# Patient Record
Sex: Female | Born: 1955
Health system: Southern US, Community
[De-identification: ages and names within clinical notes are randomized; demographics above are authoritative.]

## PROBLEM LIST (undated history)

## (undated) DIAGNOSIS — K219 Gastro-esophageal reflux disease without esophagitis: Secondary | ICD-10-CM

## (undated) DIAGNOSIS — G039 Meningitis, unspecified: Secondary | ICD-10-CM

## (undated) DIAGNOSIS — G709 Myoneural disorder, unspecified: Secondary | ICD-10-CM

## (undated) DIAGNOSIS — R569 Unspecified convulsions: Secondary | ICD-10-CM

## (undated) HISTORY — DX: Meningitis, unspecified: G03.9

## (undated) HISTORY — PX: ABDOMINAL HYSTERECTOMY: SHX81

## (undated) HISTORY — DX: Unspecified convulsions: R56.9

## (undated) HISTORY — PX: CHOLECYSTECTOMY: SHX55

## (undated) HISTORY — PX: APPENDECTOMY: SHX54

## (undated) SURGERY — Surgical Case
Anesthesia: *Unknown

---

## 2001-11-18 ENCOUNTER — Encounter: Payer: Self-pay | Admitting: Emergency Medicine

## 2001-11-18 ENCOUNTER — Emergency Department (HOSPITAL_COMMUNITY): Admission: EM | Admit: 2001-11-18 | Discharge: 2001-11-18 | Payer: Self-pay | Admitting: Emergency Medicine

## 2002-11-17 ENCOUNTER — Emergency Department (HOSPITAL_COMMUNITY): Admission: EM | Admit: 2002-11-17 | Discharge: 2002-11-17 | Payer: Self-pay | Admitting: *Deleted

## 2004-02-27 ENCOUNTER — Ambulatory Visit (HOSPITAL_COMMUNITY): Admission: RE | Admit: 2004-02-27 | Discharge: 2004-02-27 | Payer: Self-pay | Admitting: Gastroenterology

## 2004-02-27 ENCOUNTER — Encounter (INDEPENDENT_AMBULATORY_CARE_PROVIDER_SITE_OTHER): Payer: Self-pay | Admitting: Specialist

## 2008-04-18 ENCOUNTER — Emergency Department (HOSPITAL_COMMUNITY): Admission: EM | Admit: 2008-04-18 | Discharge: 2008-04-18 | Payer: Self-pay | Admitting: Family Medicine

## 2010-01-27 ENCOUNTER — Encounter: Payer: Self-pay | Admitting: Internal Medicine

## 2010-01-27 ENCOUNTER — Ambulatory Visit: Payer: Self-pay | Admitting: Internal Medicine

## 2010-01-27 DIAGNOSIS — Z9189 Other specified personal risk factors, not elsewhere classified: Secondary | ICD-10-CM | POA: Insufficient documentation

## 2010-01-27 DIAGNOSIS — M25569 Pain in unspecified knee: Secondary | ICD-10-CM

## 2010-01-27 DIAGNOSIS — Z8669 Personal history of other diseases of the nervous system and sense organs: Secondary | ICD-10-CM | POA: Insufficient documentation

## 2010-01-27 DIAGNOSIS — R569 Unspecified convulsions: Secondary | ICD-10-CM

## 2010-01-27 DIAGNOSIS — K219 Gastro-esophageal reflux disease without esophagitis: Secondary | ICD-10-CM | POA: Insufficient documentation

## 2010-02-04 ENCOUNTER — Ambulatory Visit (HOSPITAL_COMMUNITY): Admission: RE | Admit: 2010-02-04 | Discharge: 2010-02-04 | Payer: Self-pay | Admitting: Internal Medicine

## 2010-02-04 DIAGNOSIS — R928 Other abnormal and inconclusive findings on diagnostic imaging of breast: Secondary | ICD-10-CM | POA: Insufficient documentation

## 2010-02-07 ENCOUNTER — Ambulatory Visit: Payer: Self-pay | Admitting: Internal Medicine

## 2010-02-07 LAB — CONVERTED CEMR LAB
ALT: 11 units/L (ref 0–35)
AST: 16 units/L (ref 0–37)
Albumin: 3.6 g/dL (ref 3.5–5.2)
Alkaline Phosphatase: 63 units/L (ref 39–117)
BUN: 18 mg/dL (ref 6–23)
Basophils Absolute: 0.3 10*3/uL — ABNORMAL HIGH (ref 0.0–0.1)
Basophils Relative: 4.1 % — ABNORMAL HIGH (ref 0.0–3.0)
Bilirubin Urine: NEGATIVE
Bilirubin, Direct: 0 mg/dL (ref 0.0–0.3)
CO2: 29 meq/L (ref 19–32)
Calcium: 8.6 mg/dL (ref 8.4–10.5)
Chloride: 105 meq/L (ref 96–112)
Cholesterol: 238 mg/dL — ABNORMAL HIGH (ref 0–200)
Creatinine, Ser: 0.6 mg/dL (ref 0.4–1.2)
Direct LDL: 120.2 mg/dL
Eosinophils Absolute: 0.3 10*3/uL (ref 0.0–0.7)
Eosinophils Relative: 5.2 % — ABNORMAL HIGH (ref 0.0–5.0)
GFR calc non Af Amer: 120.01 mL/min (ref >60)
Glucose, Bld: 83 mg/dL (ref 70–99)
HCT: 41.3 % (ref 36.0–46.0)
HDL: 82 mg/dL (ref >39.00)
Hemoglobin, Urine: NEGATIVE
Hemoglobin: 14.3 g/dL (ref 12.0–15.0)
Ketones, ur: NEGATIVE mg/dL
Lymphocytes Relative: 43.6 % (ref 12.0–46.0)
Lymphs Abs: 2.9 10*3/uL (ref 0.7–4.0)
MCHC: 34.6 g/dL (ref 30.0–36.0)
MCV: 93.8 fL (ref 78.0–100.0)
Monocytes Absolute: 0.7 10*3/uL (ref 0.1–1.0)
Monocytes Relative: 10 % (ref 3.0–12.0)
Neutro Abs: 2.4 10*3/uL (ref 1.4–7.7)
Neutrophils Relative %: 37.1 % — ABNORMAL LOW (ref 43.0–77.0)
Nitrite: NEGATIVE
Platelets: 201 10*3/uL (ref 150.0–400.0)
Potassium: 4.3 meq/L (ref 3.5–5.1)
RBC: 4.4 M/uL (ref 3.87–5.11)
RDW: 14 % (ref 11.5–14.6)
Sodium: 140 meq/L (ref 135–145)
Specific Gravity, Urine: 1.01 (ref 1.000–1.030)
TSH: 0.84 microintl units/mL (ref 0.35–5.50)
Total Bilirubin: 0.2 mg/dL — ABNORMAL LOW (ref 0.3–1.2)
Total CHOL/HDL Ratio: 3
Total Protein, Urine: NEGATIVE mg/dL
Total Protein: 6.3 g/dL (ref 6.0–8.3)
Triglycerides: 112 mg/dL (ref 0.0–149.0)
Urine Glucose: NEGATIVE mg/dL
Urobilinogen, UA: 0.2 (ref 0.0–1.0)
VLDL: 22.4 mg/dL (ref 0.0–40.0)
WBC: 6.6 10*3/uL (ref 4.5–10.5)
pH: 6.5 (ref 5.0–8.0)

## 2010-02-12 ENCOUNTER — Encounter: Payer: Self-pay | Admitting: Internal Medicine

## 2010-02-14 ENCOUNTER — Encounter: Admission: RE | Admit: 2010-02-14 | Discharge: 2010-02-14 | Payer: Self-pay | Admitting: Internal Medicine

## 2010-05-15 ENCOUNTER — Encounter (INDEPENDENT_AMBULATORY_CARE_PROVIDER_SITE_OTHER): Payer: Self-pay | Admitting: Obstetrics and Gynecology

## 2010-05-15 ENCOUNTER — Ambulatory Visit (HOSPITAL_COMMUNITY)
Admission: RE | Admit: 2010-05-15 | Discharge: 2010-05-16 | Payer: Self-pay | Source: Home / Self Care | Attending: Obstetrics and Gynecology | Admitting: Obstetrics and Gynecology

## 2010-05-19 LAB — BASIC METABOLIC PANEL
BUN: 12 mg/dL (ref 6–23)
BUN: 9 mg/dL (ref 6–23)
CO2: 26 mEq/L (ref 19–32)
CO2: 29 mEq/L (ref 19–32)
Calcium: 8.9 mg/dL (ref 8.4–10.5)
Calcium: 8 mg/dL — ABNORMAL LOW (ref 8.4–10.5)
Chloride: 107 mEq/L (ref 96–112)
Chloride: 108 mEq/L (ref 96–112)
Creatinine, Ser: 0.53 mg/dL (ref 0.4–1.2)
Creatinine, Ser: 0.53 mg/dL (ref 0.4–1.2)
GFR calc Af Amer: >60 mL/min (ref >60)
GFR calc Af Amer: >60 mL/min (ref >60)
GFR calc non Af Amer: >60 mL/min (ref >60)
GFR calc non Af Amer: >60 mL/min (ref >60)
Glucose, Bld: 88 mg/dL (ref 70–99)
Glucose, Bld: 105 mg/dL — ABNORMAL HIGH (ref 70–99)
Potassium: 4.4 mEq/L (ref 3.5–5.1)
Potassium: 3.5 mEq/L (ref 3.5–5.1)
Sodium: 139 mEq/L (ref 135–145)
Sodium: 140 mEq/L (ref 135–145)

## 2010-05-19 LAB — CBC
HCT: 41.8 % (ref 36.0–46.0)
HCT: 31.2 % — ABNORMAL LOW (ref 36.0–46.0)
Hemoglobin: 14.2 g/dL (ref 12.0–15.0)
Hemoglobin: 10.5 g/dL — ABNORMAL LOW (ref 12.0–15.0)
MCH: 31.1 pg (ref 26.0–34.0)
MCH: 31 pg (ref 26.0–34.0)
MCHC: 34 g/dL (ref 30.0–36.0)
MCHC: 33.7 g/dL (ref 30.0–36.0)
MCV: 91.5 fL (ref 78.0–100.0)
MCV: 92 fL (ref 78.0–100.0)
Platelets: 238 10*3/uL (ref 150–400)
Platelets: 171 10*3/uL (ref 150–400)
RBC: 4.57 MIL/uL (ref 3.87–5.11)
RBC: 3.39 MIL/uL — ABNORMAL LOW (ref 3.87–5.11)
RDW: 13.8 % (ref 11.5–15.5)
RDW: 13.9 % (ref 11.5–15.5)
WBC: 8.7 10*3/uL (ref 4.0–10.5)
WBC: 7.3 10*3/uL (ref 4.0–10.5)

## 2010-05-19 LAB — SURGICAL PCR SCREEN
MRSA, PCR: NEGATIVE
Staphylococcus aureus: NEGATIVE

## 2010-05-23 NOTE — Op Note (Signed)
Diana Vega, Diana Vega            ACCOUNT NO.:  [id number]  MEDICAL RECORD NO.:  [id number]          PATIENT TYPE:  OIB  LOCATION:  9316                          FACILITY:  [facility]  PHYSICIAN:  Edelyn Heidel L. Cletus Paris, M.D.DATE OF BIRTH:  05-07-1955  DATE OF PROCEDURE:  05/15/2010 DATE OF DISCHARGE:                              OPERATIVE REPORT   PREOPERATIVE DIAGNOSIS:  Symptomatic fibroids.  POSTOPERATIVE DIAGNOSIS:  Symptomatic fibroids.  PROCEDURE:  Laparoscopic-assisted vaginal hysterectomy.  SURGEON:  Jullien Granquist L. Vincente Poli, MD  ASSISTANT:  Freddy Finner, MD  ANESTHESIA:  General.  FINDINGS:  Large fibroid uterus.  SPECIMENS:  Uterus and cervix sent to Pathology.  ESTIMATED BLOOD LOSS:  200 mL.  COMPLICATIONS:  None.  DESCRIPTION OF PROCEDURE:  The patient was taken to the operating room. She was intubated.  She was then prepped and draped in the standard fashion.  A Foley catheter was inserted and then attention was turned to the umbilicus where small infraumbilical incision was made and the Veress needle was inserted.  Pneumoperitoneum was performed in standard fashion with the Veress needle.  An 11-mm trocar was inserted.  The patient was then gently placed in Trendelenburg position and a 5-mm trocar was inserted under direct visualization.  Exam of the pelvis revealed the following.  She had a very large uterus and myomatous.  The ovaries were normal.  We used the EnSeal instrument, basically burned and cut the triple pedicle down to the round ligaments on both sides without any difficulty and with very good hemostasis.  We then went down to the vagina after we released the pneumoperitoneum and left the trocars in for a short period of time, placed a weighted speculum in the vagina, made a circumferential incision around the cervix, entered the posterior cul-de-sac sharply using Mayo scissors, and entered the anterior cul-de-sac sharply using Metzenbaum scissors.  We  then placed curved Heaney clamps across each uterosacral cardinal ligaments.  Each pedicle was clamped, cut, and suture ligated using 0 Vicryl suture.  We then carefully walked our way up the broad ligament.  I did visualize the uterine artery on either side, clamped that, staying snug beside the uterus.  Each pedicle was clamped, cut, suture ligated using 0 Vicryl suture.  At this point, the uterus was so wide, we could not get around with our clamps and we decided to morcellate especially since we had already isolated the blood supply, so then I used a scalpel and basically made a V-shaped incision through the cervix, through the uterus, and to bivalve it and then to reduce it.  We were then able to remove the very large fibroid that was over on her left side.  At that point, we were then able to get around the remainder of the broad ligament on either side with curved Heaney clamps to remove the specimen completely.  The specimen was identified as uterus, cervix with fibroids.  We then secured the pedicles with suture ligature of 0 Vicryl suture.  We then closed the vaginal cuff with a running stitch from 3-9 o'clock using a running locked stitch using 0 Vicryl suture.  Hemostasis was excellent  and we closed the cuff completely anterior to posterior using 0 Vicryl suture.  At this point, we then went back to the abdomen, replaced the pneumoperitoneum, irrigated the pelvis with Nezhat.  There was no blood noted at all in the cul-de-sac.  We noted bilateral ureteral peristalsis.  There was no need for any cautery because hemostasis was excellent.  We released the pneumoperitoneum.  The infraumbilical incision was closed using 0 Vicryl.  The skin incisions were closed using Dermabond.  All sponge, lap, and instrument counts were correct x2.  The patient went to recovery room in stable condition.     Aria Jarrard L. Vincente Poli, M.D.     Florestine Avers  D:  05/15/2010  T:  05/15/2010  Job:   [id number]  Electronically Signed by Marcelle Overlie M.D. on 05/23/2010 08:43:49 AM

## 2010-06-03 NOTE — Assessment & Plan Note (Signed)
Summary: f/u--[facility]   Vital Signs:  Patient profile:   55 year old female Height:      63 inches (160.02 cm) Weight:      199.0 pounds (90.45 kg) O2 Sat:      97 % on Room air Temp:     98.7 degrees F (37.06 degrees C) oral Pulse rate:   83 / minute BP sitting:   120 / 82  (left arm) Cuff size:   large  Vitals Entered By: Orlan Leavens RMA (February 07, 2010 2:16 PM)  O2 Flow:  Room air CC: CPX Is Patient Diabetic? No Pain Assessment Patient in pain? no        Primary Care Provider:  Newt Lukes MD  CC:  CPX.  History of Present Illness: here for followup -  still has not done CPX labs from last week - will complete today  abnormal mammo this week - f/u right breast planned - reviewed report   seizure disorder - last breakthru sz activity 11/2009 - follows with neuro - [facility]olds- for same - reports compliance with ongoing medical treatment and no changes in medication dose or frequency. denies adverse side effects related to current therapy.   Clinical Review Panels:  Prevention   Last Mammogram:  mammograms for comparison - BI-RADS 0^MM DIGITAL SCREENING (02/04/2010)  Immunizations   Last Tetanus Booster:  Tdap (02/07/2010)   Current Medications (verified): 1)  Keppra 500 Mg Tabs (Levetiracetam) .... Take 1 Two Times A Day 2)  Depakote 500 Mg Tbec (Divalproex Sodium) .... Take 3 Two Times A Day 3)  Dilantin 100 Mg Caps (Phenytoin Sodium Extended) .... Take 2 Two Times A Day 4)  Dilantin 30 Mg Caps (Phenytoin Sodium Extended) .... Take 1 At Bedtime 5)  Meloxicam 15 Mg Tabs (Meloxicam) .Marland Kitchen.. 1 By Mouth Once Daily  Allergies (verified): 1)  ! Vicodin 2)  ! Codeine 3)  ! * Eggs  Past History:  Past medical, surgical, family and social histories (including risk factors) reviewed, and no changes noted (except as noted below).  Past Medical History: seizure d/o GERD   MD roster: neuro - reynolds  Past Surgical History: Reviewed history from 01/27/2010  and no changes required. Appendectomy (1610) Cholecystectomy (1988) Tonsillectomy (childhood) Breast bx (1996)  Family History: Reviewed history from 01/27/2010 and no changes required. Family History of Arthritis (mother) Family History Breast cancer 1st degree relative <50 (father & aunt) Family History of Colon CA 1st degree relative <60 (mother) Family History Diabetes 1st degree relative (grandparent) Family History High cholesterol (parent) Kidney disease (brother) Emotional illness (nephew)  Social History: Reviewed history from 01/27/2010 and no changes required. 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 I have reviewed all other systems and they were negative.   Physical Exam  General:  overweight; alert, well-developed, well-nourished, and cooperative to examination.    Lungs:  normal respiratory effort, no intercostal retractions or use of accessory muscles; normal breath sounds bilaterally - no crackles and no wheezes.    Heart:  normal rate, regular rhythm, no murmur, and no rub. BLE without edema.  Skin:  1 cm SK left breast medially Psych:  Oriented X3, memory intact for recent and remote, normally interactive, good eye contact, not anxious appearing, not depressed appearing, and not agitated.      Impression &  Recommendations:  Problem # 1:  ABNORMAL MAMMOGRAM, RIGHT BREAST (ICD-793.80) reviewed report with pt -  will f/u with women[facility] on further testing as advised  Problem # 2:  SEIZURE DISORDER (ICD-780.39)  Her updated medication list for this problem includes:    Keppra 500 Mg Tabs (Levetiracetam) .Marland Kitchen... Take 1 two times a day    Depakote 500 Mg Tbec (Divalproex sodium) .Marland Kitchen... Take 3 two times a day    Dilantin 100 Mg Caps (Phenytoin sodium extended) .Marland Kitchen... Take 2 two times a day    Dilantin 30 Mg Caps (Phenytoin sodium  extended) .Marland Kitchen... Take 1 at bedtime  no breakthru activity since 11/2009 - cont med mgmt and levels as per neuro (reynolds) as ongoing  Complete Medication List: 1)  Keppra 500 Mg Tabs (Levetiracetam) .... Take 1 two times a day 2)  Depakote 500 Mg Tbec (Divalproex sodium) .... Take 3 two times a day 3)  Dilantin 100 Mg Caps (Phenytoin sodium extended) .... Take 2 two times a day 4)  Dilantin 30 Mg Caps (Phenytoin sodium extended) .... Take 1 at bedtime 5)  Meloxicam 15 Mg Tabs (Meloxicam) .Marland Kitchen.. 1 by mouth once daily  Other Orders: Tdap => 20yrs IM ([address]) Admin 1st Vaccine (60454)  Patient Instructions: 1)  it was good to see you today. 2)  test(s) ordered today - your results will be called to [phone] and will leave message if no answer; if any changes need to be made or there are abnormal results, you will be notified at that time 3)  look into followup with GI for colo and women[facility] for right breast abnormality - call us if help needed 4)  Please schedule a follow-up appointment in 1 year, call sooner if problems   Immunizations Administered:  Tetanus Vaccine:    Vaccine Type: Tdap    Site: left deltoid    Mfr: GlaxoSmithKline    Dose: 0.5 ml    Route: IM    Given by: Orlan Leavens RMA    Exp. Date: 02/21/2012    Lot #: [id number]    VIS given: 02/07/10

## 2010-06-03 NOTE — Assessment & Plan Note (Signed)
Summary: New / Umr / # / cd   Vital Signs:  Patient profile:   55 year old female Height:      63 inches (160.02 cm) Weight:      199.8 pounds (90.82 kg) BMI:     35.52 O2 Sat:      94 % on Room air Temp:     97.6 degrees F (36.44 degrees C) oral Pulse rate:   73 / minute BP sitting:   102 / 72  (left arm) Cuff size:   regular  Vitals Entered By: Orlan Leavens RMA (January 27, 2010 1:53 PM)  O2 Flow:  Room air CC: New patient Is Patient Diabetic? No Pain Assessment Patient in pain? no        Primary Care Provider:  Newt Lukes MD  CC:  New patient.  History of Present Illness: new pt to me and our practice, here to est care  patient is here today for annual physical. Patient feels well and has no complaints.    reviewed chronic med issues: 1) seizure disorder - last breakthru sz activity 11/2009 - follows with neuro - [facility]olds- for same - reports compliance with ongoing medical treatment and no changes in medication dose or frequency. denies adverse side effects related to current therapy.   Preventive Screening-Counseling & Management  Alcohol-Tobacco     Alcohol drinks/day: <1     Alcohol Counseling: not indicated; use of alcohol is not excessive or problematic     Smoking Status: quit     Tobacco Counseling: not to resume use of tobacco products  Caffeine-Diet-Exercise     Does Patient Exercise: no     Exercise Counseling: to improve exercise regimen     Depression Counseling: not indicated; screening negative for depression  Safety-Violence-Falls     Seat Belt Counseling: not indicated; patient wears seat belts     Helmet Counseling: not indicated; patient wears helmet when riding bicycle/motocycle     Firearm Counseling: not applicable     Violence Counseling: not indicated; no violence risk noted     Fall Risk Counseling: not indicated; no significant falls noted  Current Medications (verified): 1)  Keppra 500 Mg Tabs (Levetiracetam) .... Take 1  Two Times A Day 2)  Depakote 500 Mg Tbec (Divalproex Sodium) .... Take 3 Two Times A Day 3)  Dilantin 100 Mg Caps (Phenytoin Sodium Extended) .... Take 2 Two Times A Day 4)  Dilantin 30 Mg Caps (Phenytoin Sodium Extended) .... Take 1 At Bedtime 5)  Meloxicam 15 Mg Tabs (Meloxicam) .Marland Kitchen.. 1 By Mouth Once Daily  Allergies (verified): 1)  ! Vicodin 2)  ! Codeine 3)  ! * Eggs  Past History:  Past Medical History: seizure d/o GERD  MD roster: neuro - reynolds  Past Surgical History: Appendectomy (6433) Cholecystectomy (1988) Tonsillectomy (childhood) Breast bx (1996)  Family History: Family History of Arthritis (mother) Family History Breast cancer 1st degree relative <50 (father & aunt) Family History of Colon CA 1st degree relative <60 (mother) Family History Diabetes 1st degree relative (grandparent) Family History High cholesterol (parent) Kidney disease (brother) Emotional illness (nephew)  Social History: Former Games developer; social alcohol married, lives with spouse originally from [place] - son still lives there 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 I have reviewed all other systems  and they were negative.   Physical Exam  General:  overweight; alert, well-developed, well-nourished, and cooperative to examination.    Head:  Normocephalic and atraumatic without obvious abnormalities. No apparent alopecia or balding. Eyes:  vision grossly intact; pupils equal, round and reactive to light.  conjunctiva and lids normal.    Ears:  normal pinnae bilaterally, without erythema, swelling, or tenderness to palpation. TM largely occluded due to cerumen impaction. Hearing grossly normal bilaterally  Mouth:  teeth and gums in good repair; mucous membranes moist, without lesions or ulcers. oropharynx clear without exudate, no erythema.  Neck:   supple, full ROM, no masses, no thyromegaly; no thyroid nodules or tenderness. no JVD or carotid bruits.   Lungs:  normal respiratory effort, no intercostal retractions or use of accessory muscles; normal breath sounds bilaterally - no crackles and no wheezes.    Heart:  normal rate, regular rhythm, no murmur, and no rub. BLE without edema.  Abdomen:  soft, non-tender, normal bowel sounds, no distention; no masses and no appreciable hepatomegaly or splenomegaly.   Genitalia:  defer gyn Msk:  No deformity or scoliosis noted of thoracic or lumbar spine.  right knee: full range of motion, no joint effusion or swelling. no erythema or abnormal warmth. Stable to ligamentous testing. Nontender to palpation. Neurovascularly intact.  Neurologic:  alert & oriented X3 and cranial nerves II-XII symetrically intact.  strength normal in all extremities, sensation intact to light touch, and gait normal. speech fluent without dysarthria or aphasia; follows commands with good comprehension.  Skin:  no rashes, vesicles, ulcers, or erythema. No nodules or irregularity to palpation.  Psych:  Oriented X3, memory intact for recent and remote, normally interactive, good eye contact, not anxious appearing, not depressed appearing, and not agitated.      Impression & Recommendations:  Problem # 1:  PREVENTIVE HEALTH CARE (ICD-V70.0) Patient has been counseled on age-appropriate routine health concerns for screening and prevention. These are reviewed and up-to-date. Immunizations are up-to-date or declined. Labs and ECG reviewed.  Orders: TLB-Lipid Panel (80061-LIPID) TLB-BMP (Basic Metabolic Panel-BMET) (80048-METABOL) TLB-CBC Platelet - w/Differential (85025-CBCD) TLB-Hepatic/Liver Function Pnl (80076-HEPATIC) TLB-TSH (Thyroid Stimulating Hormone) (84443-TSH) TLB-Udip w/ Micro (81001-URINE) EKG w/ Interpretation ([address]) Gastroenterology Referral (GI) Gynecologic Referral (Gyn) Radiology Referral  (Radiology)  Problem # 2:  SEIZURE DISORDER (ICD-780.39) no breakthru activity since 11/2009 - cont med mgmt and levels as per neuro (reynolds) as ongoing Her updated medication list for this problem includes:    Keppra 500 Mg Tabs (Levetiracetam) .Marland Kitchen... Take 1 two times a day    Depakote 500 Mg Tbec (Divalproex sodium) .Marland Kitchen... Take 3 two times a day    Dilantin 100 Mg Caps (Phenytoin sodium extended) .Marland Kitchen... Take 2 two times a day    Dilantin 30 Mg Caps (Phenytoin sodium extended) .Marland Kitchen... Take 1 at bedtime  Problem # 3:  KNEE PAIN, RIGHT (ICD-719.46)  no effusion - likely post traumatic from sz 11/2009 (injury?) exam benign - try meloxicam - erx done Her updated medication list for this problem includes:    Meloxicam 15 Mg Tabs (Meloxicam) .Marland Kitchen... 1 by mouth once daily  Discussed strengthening exercises, use of ice or heat, and medications.   Complete Medication List: 1)  Keppra 500 Mg Tabs (Levetiracetam) .... Take 1 two times a day 2)  Depakote 500 Mg Tbec (Divalproex sodium) .... Take 3 two times a day 3)  Dilantin 100 Mg Caps (Phenytoin sodium extended) .... Take 2 two times a day 4)  Dilantin 30  Mg Caps (Phenytoin sodium extended) .... Take 1 at bedtime 5)  Meloxicam 15 Mg Tabs (Meloxicam) .Marland Kitchen.. 1 by mouth once daily  Patient Instructions: 1)  it was good to see you today. 2)  exam and EKG look normal -  3)  medications reviewed - use meloxicam in place of ibuprofen for knee pain - your prescription has been electronically submitted to your pharmacy. Please take as directed. Contact our office if you believe you're having problems with the medication(s).  4)  return for labs one AM when fasting (nothing to eat 8 hours before labs) - your results will be posted on the phone tree for review in 48-72 hours from the time of test completion; call [phone] and enter your 9 digit MRN (listed above on this page, just below your name); if any changes need to be made or there are abnormal results,  you will be contacted directly.  5)  it is important that you work on losing weight - monitor your diet and consume fewer calories such as less carbohydrates (sugar) and less fat. you also need to increase your physical activity level - start by walking for 10-20 minutes 3 times per week and work up to 30 minutes 4-5 times each week. 6)  we'll make referral for mammogram, gynecology eval and colonscopy as discussed . Our office will contact you regarding these appointments once made.  7)  Please schedule a follow-up appointment annually for your medical physical, call sooner if problems.  Prescriptions: MELOXICAM 15 MG TABS (MELOXICAM) 1 by mouth once daily  #30 x 0   Entered and Authorized by:   Newt Lukes MD   Signed by:   Newt Lukes MD on 01/27/2010   Method used:   Electronically to        [facility]  [phone]* (retail)       [address]       [place], [place]  [address]       Ph: [phone] or [phone]       Fax: [phone]   RxID:   [id number]

## 2010-06-06 ENCOUNTER — Encounter (INDEPENDENT_AMBULATORY_CARE_PROVIDER_SITE_OTHER): Payer: Self-pay | Admitting: *Deleted

## 2010-06-11 NOTE — Letter (Signed)
Summary: Referral - not able to see patient  [facility]  [address] [place], [place] [address]   Phone: [phone]  Fax: [phone]    June 06, 2010  Rene Paci, MD [address] [place], [place] [address]   Re:   Diana Vega DOB:  Jan 07, 1956 MRN:   [id number]    Dear Dr. Felicity Coyer:  Thank you for your kind referral of the above patient.  We have attempted to schedule the recommended procedure Screening Colonoscopy but have not been able to schedule because:  X   The patient was not available by phone and/or has not returned our calls.  ___ The patient declined to schedule the procedure at this time.  We appreciate the referral and hope that we will have the opportunity to treat this patient in the future.    Sincerely,    [facility] [phone]

## 2010-09-01 ENCOUNTER — Other Ambulatory Visit: Payer: Self-pay | Admitting: Internal Medicine

## 2010-09-19 NOTE — Op Note (Signed)
Diana Vega, Diana Vega            ACCOUNT NO.:  [id number]   MEDICAL RECORD NO.:  [id number]          PATIENT TYPE:  AMB   LOCATION:  ENDO                         FACILITY:  [facility]   PHYSICIAN:  John C. Madilyn Fireman, M.D.    DATE OF BIRTH:  [birth year]   DATE OF PROCEDURE:  02/27/2004  DATE OF DISCHARGE:                                 OPERATIVE REPORT   PROCEDURE:  Esophagogastroduodenoscopy with biopsy.   INDICATIONS FOR PROCEDURE:  Nausea, vomiting and abdominal fullness with  gradual worsening.   DESCRIPTION OF PROCEDURE:  The patient was placed in the left lateral  decubitus position then placed on the pulse monitor with continuous low flow  oxygen delivered by nasal cannula. She was sedated with 75 mcg IV fentanyl  and 6 mg IV Versed. The Olympus video endoscope was advanced under direct  vision into the oropharynx and esophagus. The esophagus was straight and of  normal caliber at the squamocolumnar line at 38 cm. There was a questionable  nodule versus focal inflammation just distal to the GE junction, it was  biopsied.  It was probably no more than 6-8 mm in diameter.  The remainder  of the stomach appeared normal including the fundus, body and antrum.  The  pylorus was nondeformed and widely patent and easily allowed passage of the  endoscope tip into the duodenum.  There was some erythema and granularity in  the duodenal bulb, the post bulbar duodenum appeared normal.  The scope was  withdrawn back into the stomach and CLOtest obtained. The scope was then  withdrawn and the patient returned to the recovery room in stable condition.  She tolerated the procedure well and there were no immediate complications.   IMPRESSION:  1.  Questionable nodule of the gastroesophageal junction.  2.  Duodenitis.   PLAN:  1.  Await CLOtest and biopsies.  2.  Await liver function tests ordered yesterday.  3.  Consider nuclear medicine gastric emptying study.  4.  Consider course or Reglan or  proton pump inhibitor keeping in mind that      she developed diarrhea in response to Prilosec earlier.     [facility]/MEDQ  D:  02/27/2004  T:  02/27/2004  Job:  [id number]   cc:   Thora Lance, M.D.  [address]  [place]  [place] [address]  Fax: [phone]

## 2010-09-25 ENCOUNTER — Other Ambulatory Visit: Payer: Self-pay | Admitting: *Deleted

## 2010-09-25 MED ORDER — MELOXICAM 15 MG PO TABS: 15.0000 mg | ORAL_TABLET | Freq: Every day | ORAL | Status: DC

## 2012-11-21 ENCOUNTER — Telehealth: Payer: Self-pay | Admitting: Neurology

## 2012-11-24 MED ORDER — DIVALPROEX SODIUM 250 MG PO DR TAB: 1500.0000 mg | DELAYED_RELEASE_TABLET | Freq: Two times a day (BID) | ORAL | Status: DC

## 2012-11-24 NOTE — Telephone Encounter (Signed)
I still have not heard back from the patient.  I called the pharmacy.  They said the 500mg  tabs of Depakote are not currently avail.  They are able to get the 250mg  tabs.  They would like a new Rx so they can dispense 250mg  and adjust dose accordingly.  Patient was previously on 500mg  (Delayed Release DR) 3 in am and 3 in pm.  I have updated and resent Rx.  Pharmacy will explain situation to the patient when she picks up meds.  I tried to call patient again, but got no answer,  Left message.

## 2013-03-24 ENCOUNTER — Other Ambulatory Visit: Payer: Self-pay | Admitting: Nurse Practitioner

## 2013-03-28 ENCOUNTER — Other Ambulatory Visit: Payer: Self-pay

## 2013-03-28 ENCOUNTER — Telehealth: Payer: Self-pay | Admitting: Nurse Practitioner

## 2013-03-28 MED ORDER — PHENYTOIN SODIUM EXTENDED 100 MG PO CAPS: 200.0000 mg | ORAL_CAPSULE | Freq: Two times a day (BID) | ORAL | Status: DC

## 2013-03-28 NOTE — Telephone Encounter (Signed)
Duplicate message.  This was already taken care of.

## 2013-04-17 ENCOUNTER — Other Ambulatory Visit: Payer: Self-pay

## 2013-04-17 MED ORDER — PHENYTOIN SODIUM EXTENDED 100 MG PO CAPS: 200.0000 mg | ORAL_CAPSULE | Freq: Two times a day (BID) | ORAL | Status: DC

## 2013-04-17 MED ORDER — DIVALPROEX SODIUM 500 MG PO DR TAB: 1500.0000 mg | DELAYED_RELEASE_TABLET | Freq: Two times a day (BID) | ORAL | Status: DC

## 2013-04-17 MED ORDER — LEVETIRACETAM 500 MG PO TABS: 500.0000 mg | ORAL_TABLET | Freq: Two times a day (BID) | ORAL | Status: DC

## 2013-04-17 MED ORDER — PHENYTOIN SODIUM EXTENDED 30 MG PO CAPS: 30.0000 mg | ORAL_CAPSULE | Freq: Every day | ORAL | Status: DC

## 2013-06-22 ENCOUNTER — Encounter: Payer: Self-pay | Admitting: Nurse Practitioner

## 2013-06-27 ENCOUNTER — Ambulatory Visit: Payer: Self-pay | Admitting: Nurse Practitioner

## 2013-06-29 ENCOUNTER — Ambulatory Visit: Payer: Self-pay | Admitting: Nurse Practitioner

## 2013-07-03 ENCOUNTER — Other Ambulatory Visit: Payer: Self-pay

## 2013-07-03 MED ORDER — LEVETIRACETAM 500 MG PO TABS: 500.0000 mg | ORAL_TABLET | Freq: Two times a day (BID) | ORAL | Status: DC

## 2013-07-12 ENCOUNTER — Other Ambulatory Visit: Payer: Self-pay

## 2013-07-12 MED ORDER — PHENYTOIN SODIUM EXTENDED 100 MG PO CAPS: 200.0000 mg | ORAL_CAPSULE | Freq: Two times a day (BID) | ORAL | Status: DC

## 2013-07-12 MED ORDER — LEVETIRACETAM 500 MG PO TABS: 500.0000 mg | ORAL_TABLET | Freq: Two times a day (BID) | ORAL | Status: DC

## 2013-07-12 MED ORDER — DIVALPROEX SODIUM 500 MG PO DR TAB: 1500.0000 mg | DELAYED_RELEASE_TABLET | Freq: Two times a day (BID) | ORAL | Status: DC

## 2013-07-12 MED ORDER — PHENYTOIN SODIUM EXTENDED 30 MG PO CAPS: 30.0000 mg | ORAL_CAPSULE | Freq: Every day | ORAL | Status: DC

## 2013-09-12 ENCOUNTER — Other Ambulatory Visit: Payer: Self-pay | Admitting: Neurology

## 2013-09-28 ENCOUNTER — Encounter: Payer: Self-pay | Admitting: Nurse Practitioner

## 2013-09-28 ENCOUNTER — Ambulatory Visit (INDEPENDENT_AMBULATORY_CARE_PROVIDER_SITE_OTHER): Payer: 59 | Admitting: Nurse Practitioner

## 2013-09-28 ENCOUNTER — Encounter (INDEPENDENT_AMBULATORY_CARE_PROVIDER_SITE_OTHER): Payer: Self-pay

## 2013-09-28 VITALS — BP 120/72 | HR 66 | Ht 62.5 in | Wt 196.0 lb

## 2013-09-28 DIAGNOSIS — G40309 Generalized idiopathic epilepsy and epileptic syndromes, not intractable, without status epilepticus: Secondary | ICD-10-CM | POA: Insufficient documentation

## 2013-09-28 DIAGNOSIS — Z5181 Encounter for therapeutic drug level monitoring: Secondary | ICD-10-CM

## 2013-09-28 NOTE — Patient Instructions (Signed)
Check labs today Renew meds once levels are back  followup yearly.

## 2013-09-28 NOTE — Progress Notes (Signed)
[facility]  PATIENT: Diana Vega DOB: 05/16/1955   REASON FOR VISIT: Followup for seizure disorder    HISTORY OF PRESENT ILLNESS:Diana Vega, 58 year old female returns for followup. She was last seen in our office 03/23/2012 She has history of seizure disorder last seizure occurring in March of this year after missing several doses of her medication due to being nauseated and vomiting from the flu. She is currently on Dilantin Keppra and Depakote. She has not had labs drawn even though  they have been ordered after her last visit. She returns today for followup.     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 She was initially on Dilantin, Depakote, and phenobarbital. She was tapered off phenobarbital in 2006, had some breakthrough "small" seizures, and at that time was placed on Keppra. 3  sz Dec, May, June- 2007 each assoc w/ missed doses, first 2 w/ illness- May sz had rib fx, better now- post-ictal confusion- most recent sz had stopped Dilantin 4d earlier due to "tiredness"- did feel more awake prior to sz, now back- has failed Neurontin, Lamictal in past-  REVIEW OF SYSTEMS: Full 14 system review of systems performed and notable only for those listed, all others are neg:  Constitutional: Fatigue Cardiovascular: N/A  Ear/Nose/Throat: N/A  Skin: N/A  Eyes: N/A  Respiratory: N/A  Gastroitestinal: N/A  Hematology/Lymphatic: N/A  Endocrine: Intolerance to heat  Musculoskeletal: Aching muscles Allergy/Immunology: N/A  Neurological: Seizure  Psychiatric: N/A Sleep : NA   ALLERGIES: Allergies  Allergen Reactions  . Dilaudid [Hydromorphone Hcl]     acute renal failure   . Codeine     REACTION: GI upsets  . Eggs Or Egg-Derived Products   . Hydrocodone-Acetaminophen     REACTION: GI upsets    HOME MEDICATIONS: Outpatient  Prescriptions Prior to Visit  Medication Sig Dispense Refill  . divalproex (DEPAKOTE) 500 MG DR tablet TAKE 3 TABLETS BY MOUTH 2 TIMES DAILY.  180 tablet  2  . levETIRAcetam (KEPPRA) 500 MG tablet Take 1 tablet (500 mg total) by mouth 2 (two) times daily.  60 tablet  0  . phenytoin (DILANTIN) 100 MG ER capsule Take 2 capsules (200 mg total) by mouth 2 (two) times daily.  120 capsule  0  . phenytoin (DILANTIN) 30 MG ER capsule Take 1 capsule (30 mg total) by mouth at bedtime.  30 capsule  0  . meloxicam (MOBIC) 15 MG tablet Take 1 tablet (15 mg total) by mouth daily.  30 tablet  1   No facility-administered medications prior to visit.    PAST MEDICAL HISTORY: Past Medical History  Diagnosis Date  . Seizure   . Meningitis     PAST SURGICAL HISTORY: Past Surgical History  Procedure Laterality Date  . Cholecystectomy      FAMILY HISTORY: Family History  Problem Relation Age of Onset  . Cancer Mother   . Cancer Father   . Cancer Brother     SOCIAL HISTORY: History   Social History  . Marital Status: Married    Spouse Name: N/A    Number of Children: 4  . Years of Education: College   Occupational History  . RN [employer]   Social History Main Topics  . Smoking status: Current Every Day Smoker  . Smokeless tobacco: Never Used  . Alcohol Use: Yes  Comment: 8 oz monthly   . Drug Use: No  . Sexual Activity: Not on file   Other Topics Concern  . Not on file   Social History Narrative   Patient lives at home with her husband.    Patient has 4 children.    Patient drinks 7 cups of caffeine daily.    Patient works as a Charity fundraiser for [employer].      PHYSICAL EXAM  Filed Vitals:   09/28/13 1420  BP: 120/72  Pulse: 66  Height: 5' 2.5" (1.588 m)  Weight: 196 lb (88.905 kg)   Body mass index is 35.26 kg/(m^2).  Generalized: Well developed, in no acute distress  Head: normocephalic and atraumatic,. Oropharynx benign  Neck: Supple, no carotid bruits  Cardiac:  Regular rate rhythm, no murmur  Musculoskeletal: No deformity   Neurological examination   Mentation: Alert oriented to time, place, history taking. Follows all commands speech and language fluent  Cranial nerve II-XII: Pupils were equal round reactive to light extraocular movements were full, visual field were full on confrontational test. Facial sensation and strength were normal. hearing was intact to finger rubbing bilaterally. Uvula tongue midline. head turning and shoulder shrug were normal and symmetric.Tongue protrusion into cheek strength was normal. Motor: normal bulk and tone, full strength in the BUE, BLE,  No focal weakness Coordination: finger-nose-finger, heel-to-shin bilaterally, no dysmetria Reflexes: Brachioradialis 2/2, biceps 2/2, triceps 2/2, patellar 2/2, Achilles 2/2, plantar responses were flexor bilaterally. Gait and Station: Rising up from seated position without assistance, normal stance,  moderate stride, good arm swing, smooth turning, able to perform tiptoe, and heel walking without difficulty. Tandem gait is steady  DIAGNOSTIC DATA (LABS, IMAGING, TESTING) -  ASSESSMENT AND PLAN  58 y.o. year old female  has a past medical history of Seizure here to followup.Last visit to the office 03/23/12.  Check labs today, CBC, CMP, VPA and Dilantin levels.  Renew meds once levels are back  followup yearly. Nilda Riggs, [facility], [facility], APRN  [facility] [address] [place], [place] [address] [phone]

## 2013-09-29 ENCOUNTER — Other Ambulatory Visit: Payer: Self-pay | Admitting: Nurse Practitioner

## 2013-09-29 LAB — CBC WITH DIFFERENTIAL/PLATELET
Basophils Absolute: 0 10*3/uL (ref 0.0–0.2)
Basos: 0 %
Eos: 6 %
Eosinophils Absolute: 0.4 10*3/uL (ref 0.0–0.4)
HCT: 43 % (ref 34.0–46.6)
Hemoglobin: 14 g/dL (ref 11.1–15.9)
Immature Grans (Abs): 0 10*3/uL (ref 0.0–0.1)
Immature Granulocytes: 0 %
Lymphocytes Absolute: 3.6 10*3/uL — ABNORMAL HIGH (ref 0.7–3.1)
Lymphs: 52 %
MCH: 29.9 pg (ref 26.6–33.0)
MCHC: 32.6 g/dL (ref 31.5–35.7)
MCV: 92 fL (ref 79–97)
Monocytes Absolute: 0.6 10*3/uL (ref 0.1–0.9)
Monocytes: 9 %
Neutrophils Absolute: 2.3 10*3/uL (ref 1.4–7.0)
Neutrophils Relative %: 33 %
RBC: 4.69 x10E6/uL (ref 3.77–5.28)
RDW: 13.8 % (ref 12.3–15.4)
WBC: 6.9 10*3/uL (ref 3.4–10.8)

## 2013-09-29 LAB — COMPREHENSIVE METABOLIC PANEL
ALT: 11 IU/L (ref 0–32)
AST: 16 IU/L (ref 0–40)
Albumin/Globulin Ratio: 2 (ref 1.1–2.5)
Albumin: 4.2 g/dL (ref 3.5–5.5)
Alkaline Phosphatase: 98 IU/L (ref 39–117)
BUN/Creatinine Ratio: 28 — ABNORMAL HIGH (ref 9–23)
BUN: 16 mg/dL (ref 6–24)
CO2: 22 mmol/L (ref 18–29)
Calcium: 9.5 mg/dL (ref 8.7–10.2)
Chloride: 103 mmol/L (ref 97–108)
Creatinine, Ser: 0.57 mg/dL (ref 0.57–1.00)
GFR calc Af Amer: 119 mL/min/{1.73_m2} (ref >59)
GFR calc non Af Amer: 103 mL/min/{1.73_m2} (ref >59)
Globulin, Total: 2.1 g/dL (ref 1.5–4.5)
Glucose: 70 mg/dL (ref 65–99)
Potassium: 4.2 mmol/L (ref 3.5–5.2)
Sodium: 141 mmol/L (ref 134–144)
Total Bilirubin: <0.2 mg/dL (ref 0.0–1.2)
Total Protein: 6.3 g/dL (ref 6.0–8.5)

## 2013-09-29 LAB — PHENYTOIN LEVEL, TOTAL: Phenytoin Lvl: 12 ug/mL (ref 10.0–20.0)

## 2013-09-29 LAB — VALPROIC ACID LEVEL: Valproic Acid Lvl: 55 ug/mL (ref 50–100)

## 2013-09-29 MED ORDER — LEVETIRACETAM 500 MG PO TABS: 500.0000 mg | ORAL_TABLET | Freq: Two times a day (BID) | ORAL | Status: DC

## 2013-09-29 MED ORDER — PHENYTOIN SODIUM EXTENDED 100 MG PO CAPS: 200.0000 mg | ORAL_CAPSULE | Freq: Two times a day (BID) | ORAL | Status: DC

## 2013-09-29 MED ORDER — DIVALPROEX SODIUM 500 MG PO DR TAB
1500.0000 mg | DELAYED_RELEASE_TABLET | Freq: Two times a day (BID) | ORAL | Status: DC
Start: 2013-09-29 — End: 2014-10-02

## 2013-09-29 MED ORDER — PHENYTOIN SODIUM EXTENDED 30 MG PO CAPS: 30.0000 mg | ORAL_CAPSULE | Freq: Every day | ORAL | Status: DC

## 2013-09-29 NOTE — Progress Notes (Signed)
Quick Note:  I called pt and relayed that labs looked good. Refills called in (47mo supply with 3 refills). Pt verbalized understanding. ______

## 2013-09-29 NOTE — Progress Notes (Signed)
I agree with the assessment and plan as directed by NP .The patient is known to me .   Luria Rosario, MD  

## 2013-11-24 ENCOUNTER — Ambulatory Visit: Payer: Self-pay | Admitting: Nurse Practitioner

## 2014-01-19 ENCOUNTER — Other Ambulatory Visit: Payer: Self-pay | Admitting: Neurology

## 2014-10-02 ENCOUNTER — Ambulatory Visit (INDEPENDENT_AMBULATORY_CARE_PROVIDER_SITE_OTHER): Payer: 59 | Admitting: Nurse Practitioner

## 2014-10-02 ENCOUNTER — Encounter: Payer: Self-pay | Admitting: Nurse Practitioner

## 2014-10-02 VITALS — BP 139/73 | HR 75 | Ht 62.0 in | Wt 210.4 lb

## 2014-10-02 DIAGNOSIS — G40309 Generalized idiopathic epilepsy and epileptic syndromes, not intractable, without status epilepticus: Secondary | ICD-10-CM | POA: Diagnosis not present

## 2014-10-02 DIAGNOSIS — Z5181 Encounter for therapeutic drug level monitoring: Secondary | ICD-10-CM

## 2014-10-02 DIAGNOSIS — R5601 Complex febrile convulsions: Secondary | ICD-10-CM | POA: Diagnosis not present

## 2014-10-02 MED ORDER — PHENYTOIN SODIUM EXTENDED 100 MG PO CAPS: 200.0000 mg | ORAL_CAPSULE | Freq: Two times a day (BID) | ORAL | Status: DC

## 2014-10-02 MED ORDER — PHENYTOIN SODIUM EXTENDED 30 MG PO CAPS: 30.0000 mg | ORAL_CAPSULE | Freq: Every day | ORAL | Status: DC

## 2014-10-02 MED ORDER — LEVETIRACETAM 500 MG PO TABS: 500.0000 mg | ORAL_TABLET | Freq: Two times a day (BID) | ORAL | Status: DC

## 2014-10-02 MED ORDER — DIVALPROEX SODIUM 500 MG PO DR TAB: 1500.0000 mg | DELAYED_RELEASE_TABLET | Freq: Two times a day (BID) | ORAL | Status: DC

## 2014-10-02 NOTE — Progress Notes (Signed)
[facility]  PATIENT: Diana Vega DOB: 10/30/1955   REASON FOR VISIT: Follow-up for seizure disorder  HISTORY FROM: Patient    HISTORY OF PRESENT ILLNESS:Diana Vega, 59 year old female returns for followup. She was last seen in our office 09/28/2013 . She has history of seizure disorder last seizure occurring several weeks ago after missing her morning doses of medication . She is currently on Dilantin Keppra and Depakote. Her levels at her last visit were therapeutic. She returns today for followup. 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 She was initially on Dilantin, Depakote, and phenobarbital. She was tapered off phenobarbital in 2006, had some breakthrough "small" seizures, and at that time was placed on Keppra. 3 sz Dec, May, June- 2007 each assoc w/ missed doses, first 2 w/ illness- May sz had rib fx, better now- post-ictal confusion- most recent sz had stopped Dilantin 4d earlier due to "tiredness"- did feel more awake prior to sz, now back- has failed Neurontin, Lamictal in past-    REVIEW OF SYSTEMS: Full 14 system review of systems performed and notable only for those listed, all others are neg:  Constitutional: Fatigue, excessive sweating Cardiovascular: neg Ear/Nose/Throat: neg  Skin: neg Eyes: neg Respiratory: neg Gastroitestinal: neg  Hematology/Lymphatic: neg  Endocrine: neg Musculoskeletal:neg Allergy/Immunology: neg Neurological: neg Psychiatric: neg Sleep : neg   ALLERGIES: Allergies  Allergen Reactions  . Dilaudid [Hydromorphone Hcl]     acute renal failure   . Codeine     REACTION: GI upsets  . Eggs Or Egg-Derived Products   . Hydrocodone-Acetaminophen     REACTION: GI upsets  . Toradol [Ketorolac Tromethamine]     ? Acute renal failure.    HOME MEDICATIONS: Outpatient  Prescriptions Prior to Visit  Medication Sig Dispense Refill  . divalproex (DEPAKOTE) 500 MG DR tablet Take 3 tablets (1,500 mg total) by mouth 2 (two) times daily. 540 tablet 3  . levETIRAcetam (KEPPRA) 500 MG tablet Take 1 tablet (500 mg total) by mouth 2 (two) times daily. 180 tablet 3  . phenytoin (DILANTIN) 100 MG ER capsule Take 2 capsules (200 mg total) by mouth 2 (two) times daily. 360 capsule 3  . phenytoin (DILANTIN) 30 MG ER capsule Take 1 capsule (30 mg total) by mouth at bedtime. 90 capsule 3  . DILANTIN 30 MG ER capsule TAKE 1 CAPSULE BY MOUTH AT BEDTIME. 90 capsule 2   No facility-administered medications prior to visit.    PAST MEDICAL HISTORY: Past Medical History  Diagnosis Date  . Seizure   . Meningitis     PAST SURGICAL HISTORY: Past Surgical History  Procedure Laterality Date  . Cholecystectomy      FAMILY HISTORY: Family History  Problem Relation Age of Onset  . Cancer Mother   . Cancer Father   . Cancer Brother     SOCIAL HISTORY: History   Social History  . Marital Status: Married    Spouse Name: N/A  . Number of Children: 4  . Years of Education: College   Occupational History  . RN [employer]   Social History Main Topics  . Smoking status: Current Every Day Smoker -- 0.50 packs/day    Types: Cigarettes  . Smokeless tobacco: Never Used  . Alcohol Use: No  . Drug Use: No  . Sexual Activity: Not on file  Other Topics Concern  . Not on file   Social History Narrative   Patient lives at home with her husband.    Patient has 4 children.    Patient drinks 3 cups of caffeine daily.    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      PHYSICAL EXAM  Filed Vitals:   10/02/14 1456  BP: 139/73  Pulse: 75  Height: 5\' 2"  (1.575 m)  Weight: 210 lb 6.4 oz (95.437 kg)   Body mass index is 38.47 kg/(m^2). Generalized: Well developed, in no acute distress  Head: normocephalic and atraumatic,. Oropharynx benign  Neck: Supple, no carotid  bruits  Cardiac: Regular rate rhythm, no murmur  Musculoskeletal: No deformity   Neurological examination   Mentation: Alert oriented to time, place, history taking. Follows all commands speech and language fluent  Cranial nerve II-XII: Pupils were equal round reactive to light extraocular movements were full, visual field were full on confrontational test. Facial sensation and strength were normal. hearing was intact to finger rubbing bilaterally. Uvula tongue midline. head turning and shoulder shrug were normal and symmetric.Tongue protrusion into cheek strength was normal. Motor: normal bulk and tone, full strength in the BUE, BLE, No focal weakness Coordination: finger-nose-finger, heel-to-shin bilaterally, no dysmetria Reflexes: Brachioradialis 2/2, biceps 2/2, triceps 2/2, patellar 2/2, Achilles 2/2, plantar responses were flexor bilaterally. Gait and Station: Rising up from seated position without assistance, normal stance, moderate stride, good arm swing, smooth turning, able to perform tiptoe, and heel walking without difficulty. Tandem gait is steady   DIAGNOSTIC DATA (LABS, IMAGING, TESTING)   ASSESSMENT AND PLAN  59 y.o. year old female  has a past medical history of Seizure here to follow-up. She is currently on Depakote, Dilantin, and Keppra. Recent seizure was due to missing a dose of her medications.  Continue Depakote at current dose will refill Continue Dilantin at current dose will refill Continue Keppra at current dose will refill Check labs today Follow-up yearly and when necessary Nilda RiggsNancy Carolyn Martin, [facility], [facility], APRN  [facility] [address], [address] [place], [place] [address] [phone]

## 2014-10-02 NOTE — Patient Instructions (Signed)
Continue Depakote at current dose will refill Continue Dilantin at current dose will refill Continue Keppra at current dose will refill Check labs today Follow-up yearly and when necessary

## 2014-10-03 LAB — COMPREHENSIVE METABOLIC PANEL
ALT: 10 IU/L (ref 0–32)
AST: 14 IU/L (ref 0–40)
Albumin/Globulin Ratio: 2 (ref 1.1–2.5)
Albumin: 4 g/dL (ref 3.5–5.5)
Alkaline Phosphatase: 97 IU/L (ref 39–117)
BUN / CREAT RATIO: 29 — AB (ref 9–23)
BUN: 14 mg/dL (ref 6–24)
Bilirubin Total: <0.2 mg/dL (ref 0.0–1.2)
CO2: 24 mmol/L (ref 18–29)
Calcium: 9.1 mg/dL (ref 8.7–10.2)
Chloride: 103 mmol/L (ref 97–108)
Creatinine, Ser: 0.49 mg/dL — ABNORMAL LOW (ref 0.57–1.00)
GFR calc non Af Amer: 108 mL/min/{1.73_m2} (ref >59)
GFR, EST AFRICAN AMERICAN: 124 mL/min/{1.73_m2} (ref >59)
GLOBULIN, TOTAL: 2 g/dL (ref 1.5–4.5)
GLUCOSE: 89 mg/dL (ref 65–99)
Potassium: 4.8 mmol/L (ref 3.5–5.2)
SODIUM: 142 mmol/L (ref 134–144)
Total Protein: 6 g/dL (ref 6.0–8.5)

## 2014-10-03 LAB — CBC WITH DIFFERENTIAL/PLATELET
Basophils Absolute: 0 10*3/uL (ref 0.0–0.2)
Basos: 0 %
EOS (ABSOLUTE): 0.4 10*3/uL (ref 0.0–0.4)
Eos: 6 %
Hematocrit: 40 % (ref 34.0–46.6)
Hemoglobin: 13.7 g/dL (ref 11.1–15.9)
Immature Grans (Abs): 0 10*3/uL (ref 0.0–0.1)
Immature Granulocytes: 0 %
LYMPHS: 47 %
Lymphocytes Absolute: 3.1 10*3/uL (ref 0.7–3.1)
MCH: 31 pg (ref 26.6–33.0)
MCHC: 34.3 g/dL (ref 31.5–35.7)
MCV: 91 fL (ref 79–97)
MONOCYTES: 10 %
MONOS ABS: 0.7 10*3/uL (ref 0.1–0.9)
Neutrophils Absolute: 2.4 10*3/uL (ref 1.4–7.0)
Neutrophils: 37 %
Platelets: 265 10*3/uL (ref 150–379)
RBC: 4.42 x10E6/uL (ref 3.77–5.28)
RDW: 13.9 % (ref 12.3–15.4)
WBC: 6.6 10*3/uL (ref 3.4–10.8)

## 2014-10-03 LAB — VALPROIC ACID LEVEL: VALPROIC ACID LVL: 35 ug/mL — AB (ref 50–100)

## 2014-10-03 LAB — PHENYTOIN LEVEL, TOTAL: PHENYTOIN LVL: 9.6 ug/mL — AB (ref 10.0–20.0)

## 2015-07-03 MED FILL — levETIRAcetam 500 MG TABS: 500 | 90 days supply | Qty: 180 | Fill #2

## 2015-07-03 MED FILL — DILANTIN 30 MG CAPSULE: 30 | 90 days supply | Qty: 90 | Fill #2

## 2015-07-03 MED FILL — DIVALPROEX SOD DR 500 MG TA: 500 | 90 days supply | Qty: 540 | Fill #2

## 2015-07-03 MED FILL — PHENYTOIN SOD EXT 100 MG CA: 100 | 90 days supply | Qty: 360 | Fill #2

## 2015-10-28 ENCOUNTER — Other Ambulatory Visit: Payer: Self-pay | Admitting: *Deleted

## 2015-10-28 MED ORDER — PHENYTOIN SODIUM EXTENDED 30 MG PO CAPS: 30.0000 mg | ORAL_CAPSULE | Freq: Every day | ORAL | Status: DC

## 2015-10-28 MED ORDER — PHENYTOIN SODIUM EXTENDED 100 MG PO CAPS: 200.0000 mg | ORAL_CAPSULE | Freq: Two times a day (BID) | ORAL | Status: DC

## 2015-10-28 MED ORDER — LEVETIRACETAM 500 MG PO TABS: 500.0000 mg | ORAL_TABLET | Freq: Two times a day (BID) | ORAL | Status: DC

## 2015-10-28 MED ORDER — DIVALPROEX SODIUM 500 MG PO DR TAB: 1500.0000 mg | DELAYED_RELEASE_TABLET | Freq: Two times a day (BID) | ORAL | Status: DC

## 2015-10-28 MED FILL — levETIRAcetam 500 MG TABS: 500 | 90 days supply | Qty: 180 | Fill #0

## 2015-10-28 MED FILL — DIVALPROEX SOD DR 500 MG TA: 500 | 90 days supply | Qty: 540 | Fill #0

## 2015-10-28 MED FILL — PHENYTOIN SOD EXT 100 MG CA: 100 | 90 days supply | Qty: 360 | Fill #0

## 2015-10-28 MED FILL — DILANTIN 30 MG CAPSULE: 30 | 90 days supply | Qty: 90 | Fill #0

## 2015-10-28 NOTE — Telephone Encounter (Signed)
Spoke to pt and she needed refills,  I made appt for next 11-07-15 at 1130 with CM/NP.  Pt would like to see MD next time.  She asked for dr. Anne HahnWillis.

## 2015-11-07 ENCOUNTER — Encounter: Payer: Self-pay | Admitting: Nurse Practitioner

## 2015-11-07 ENCOUNTER — Ambulatory Visit (INDEPENDENT_AMBULATORY_CARE_PROVIDER_SITE_OTHER): Payer: 59 | Admitting: Nurse Practitioner

## 2015-11-07 ENCOUNTER — Other Ambulatory Visit: Payer: Self-pay | Admitting: Nurse Practitioner

## 2015-11-07 VITALS — BP 118/76 | HR 68 | Ht 62.0 in | Wt 195.0 lb

## 2015-11-07 DIAGNOSIS — G40309 Generalized idiopathic epilepsy and epileptic syndromes, not intractable, without status epilepticus: Secondary | ICD-10-CM | POA: Diagnosis not present

## 2015-11-07 DIAGNOSIS — Z5181 Encounter for therapeutic drug level monitoring: Secondary | ICD-10-CM

## 2015-11-07 MED ORDER — PHENYTOIN SODIUM EXTENDED 30 MG PO CAPS: 30.0000 mg | ORAL_CAPSULE | Freq: Every day | ORAL | Status: DC

## 2015-11-07 MED ORDER — DIVALPROEX SODIUM 500 MG PO DR TAB: 1500.0000 mg | DELAYED_RELEASE_TABLET | Freq: Two times a day (BID) | ORAL | Status: DC

## 2015-11-07 MED ORDER — PHENYTOIN SODIUM EXTENDED 100 MG PO CAPS: 200.0000 mg | ORAL_CAPSULE | Freq: Two times a day (BID) | ORAL | Status: DC

## 2015-11-07 MED ORDER — LEVETIRACETAM 500 MG PO TABS: 500.0000 mg | ORAL_TABLET | Freq: Two times a day (BID) | ORAL | Status: DC

## 2015-11-07 NOTE — Progress Notes (Addendum)
[facility]  PATIENT: Diana Vega DOB: 06/26/1955   REASON FOR VISIT: Follow-up for generalized convulsive epilepsy HISTORY FROM: Patient    HISTORY OF PRESENT ILLNESS:Diana Vega, 60 year old female returns for followup. She was last seen in our office 10/02/14.  She has history of seizure disorder . Has been doing well.  Has noted petit mal sz when has problem taking depakote at night on an empty stomach. This is rare.    No generalized sz. She is currently on Dilantin Keppra and Depakote.  She returns today for followup. 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 She was initially on Dilantin, Depakote, and phenobarbital. She was tapered off phenobarbital in 2006, had some breakthrough "small" seizures, and at that time was placed on Keppra. 3 sz Dec, May, June- 2007 each assoc w/ missed doses, first 2 w/ illness- May sz had rib fx, better now- post-ictal confusion- most recent sz had stopped Dilantin 4d earlier due to "tiredness"- did feel more awake prior to sz, now back- has failed Neurontin, Lamictal in past-    REVIEW OF SYSTEMS: Full 14 system review of systems performed and notable only for those listed, all others are neg:  Constitutional: neg  Cardiovascular: neg Ear/Nose/Throat: neg  Skin: neg Eyes: neg Respiratory: neg Gastroitestinal: neg  Hematology/Lymphatic: neg  Endocrine: neg Musculoskeletal:neg Allergy/Immunology: neg Neurological: History of seizures Psychiatric: neg Sleep : neg   ALLERGIES: Allergies  Allergen Reactions  . Dilaudid [Hydromorphone Hcl]     acute renal failure   . Codeine     REACTION: GI upsets  . Eggs Or Egg-Derived Products   . Hydrocodone-Acetaminophen     REACTION: GI upsets  . Toradol [Ketorolac Tromethamine]     ? Acute renal failure.    HOME  MEDICATIONS: Outpatient Prescriptions Prior to Visit  Medication Sig Dispense Refill  . divalproex (DEPAKOTE) 500 MG DR tablet Take 3 tablets (1,500 mg total) by mouth 2 (two) times daily. 540 tablet 0  . levETIRAcetam (KEPPRA) 500 MG tablet Take 1 tablet (500 mg total) by mouth 2 (two) times daily. 180 tablet 0  . phenytoin (DILANTIN) 100 MG ER capsule Take 2 capsules (200 mg total) by mouth 2 (two) times daily. 360 capsule 0  . phenytoin (DILANTIN) 30 MG ER capsule Take 1 capsule (30 mg total) by mouth at bedtime. 90 capsule 0   No facility-administered medications prior to visit.    PAST MEDICAL HISTORY: Past Medical History  Diagnosis Date  . Seizure (HCC)   . Meningitis     PAST SURGICAL HISTORY: Past Surgical History  Procedure Laterality Date  . Cholecystectomy      FAMILY HISTORY: Family History  Problem Relation Age of Onset  . Cancer Mother   . Cancer Father     Breast  . Cancer Brother     Prostate  . Cancer Brother     Prostate    SOCIAL HISTORY: Social History   Social History  . Marital Status: Married    Spouse Name: N/A  . Number of Children: 4  . Years of Education: College   Occupational History  . RN [employer]   Social History Main Topics  . Smoking status: Current Every Day Smoker -- 0.50 packs/day    Types: Cigarettes  . Smokeless tobacco: Never Used  . Alcohol Use: No  .  Drug Use: No  . Sexual Activity: Not on file   Other Topics Concern  . Not on file   Social History Narrative   Patient lives at home with her husband.    Patient has 4 children.    Patient drinks 3 cups of caffeine daily.    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      PHYSICAL EXAM  Filed Vitals:   11/07/15 1112  BP: 118/76  Pulse: 68  Height: 5\' 2"  (1.575 m)  Weight: 195 lb (88.451 kg)   Body mass index is 35.66 kg/(m^2). Generalized: Well developed, Obese female in no acute distress  Head: normocephalic and atraumatic,. Oropharynx benign   Neck: Supple,  Cardiac: Regular rate rhythm, no murmur  Musculoskeletal: No deformity   Neurological examination   Mentation: Alert oriented to time, place, history taking. Follows all commands speech and language fluent  Cranial nerve II-XII: Pupils were equal round reactive to light extraocular movements were full, visual field were full on confrontational test. Facial sensation and strength were normal. hearing was intact to finger rubbing bilaterally. Uvula tongue midline. head turning and shoulder shrug were normal and symmetric.Tongue protrusion into cheek strength was normal. Motor: normal bulk and tone, full strength in the BUE, BLE, No focal weakness Coordination: finger-nose-finger, heel-to-shin bilaterally, no dysmetria Reflexes: Brachioradialis 2/2, biceps 2/2, triceps 2/2, patellar 2/2, Achilles 2/2, plantar responses were flexor bilaterally. Gait and Station: Rising up from seated position without assistance, normal stance, moderate stride, good arm swing, smooth turning, able to perform tiptoe, and heel walking without difficulty. Tandem gait is steady DIAGNOSTIC DATA (LABS, IMAGING, TESTING) -   ASSESSMENT AND PLAN 60 y.o. year old female has a past medical history of Seizure here to follow-up. She is currently on Depakote, Dilantin, and Keppra. Recent seizure was due to missing a dose of her medications.The patient is a current patient of Dr. Vickey Hugerohmeier  who is out of the office today . This note is sent to the work in doctor.     Continue Depakote at current dose will refill Continue Dilantin at current dose will refill Continue Keppra at current dose will refill Check labs today Follow-up yearly and when necessary Nilda RiggsNancy Carolyn Martin, [facility], [facility], APRN  [facility] [address], [address] [place], [place] [address] [phone]  I reviewed the above note and documentation by the Nurse Practitioner and agree with the history, physical exam,  assessment and plan as outlined above. I was immediately available for face-to-face consultation. Huston FoleySaima Athar, MD, PhD [facility]

## 2015-11-07 NOTE — Patient Instructions (Signed)
Continue Depakote at current dose will refill Continue Dilantin at current dose will refill Continue Keppra at current dose will refill Check labs today Follow-up yearly and when necessary

## 2015-11-08 ENCOUNTER — Telehealth: Payer: Self-pay | Admitting: *Deleted

## 2015-11-08 LAB — COMPREHENSIVE METABOLIC PANEL
ALBUMIN: 4.1 g/dL (ref 3.5–5.5)
ALT: 9 IU/L (ref 0–32)
AST: 14 IU/L (ref 0–40)
Albumin/Globulin Ratio: 1.6 (ref 1.2–2.2)
Alkaline Phosphatase: 106 IU/L (ref 39–117)
BUN / CREAT RATIO: 25 — AB (ref 9–23)
BUN: 14 mg/dL (ref 6–24)
CHLORIDE: 98 mmol/L (ref 96–106)
CO2: 22 mmol/L (ref 18–29)
CREATININE: 0.56 mg/dL — AB (ref 0.57–1.00)
Calcium: 9.3 mg/dL (ref 8.7–10.2)
GFR calc non Af Amer: 102 mL/min/{1.73_m2} (ref >59)
GFR, EST AFRICAN AMERICAN: 118 mL/min/{1.73_m2} (ref >59)
GLOBULIN, TOTAL: 2.6 g/dL (ref 1.5–4.5)
GLUCOSE: 81 mg/dL (ref 65–99)
Potassium: 4.8 mmol/L (ref 3.5–5.2)
Sodium: 139 mmol/L (ref 134–144)
TOTAL PROTEIN: 6.7 g/dL (ref 6.0–8.5)

## 2015-11-08 LAB — CBC WITH DIFFERENTIAL/PLATELET
BASOS ABS: 0 10*3/uL (ref 0.0–0.2)
BASOS: 0 %
EOS (ABSOLUTE): 0.3 10*3/uL (ref 0.0–0.4)
EOS: 4 %
HEMATOCRIT: 42.8 % (ref 34.0–46.6)
HEMOGLOBIN: 14.4 g/dL (ref 11.1–15.9)
IMMATURE GRANS (ABS): 0 10*3/uL (ref 0.0–0.1)
Immature Granulocytes: 0 %
LYMPHS: 45 %
Lymphocytes Absolute: 3.3 10*3/uL — ABNORMAL HIGH (ref 0.7–3.1)
MCH: 31 pg (ref 26.6–33.0)
MCHC: 33.6 g/dL (ref 31.5–35.7)
MCV: 92 fL (ref 79–97)
MONOCYTES: 8 %
Monocytes Absolute: 0.6 10*3/uL (ref 0.1–0.9)
NEUTROS ABS: 3.2 10*3/uL (ref 1.4–7.0)
Neutrophils: 43 %
Platelets: 261 10*3/uL (ref 150–379)
RBC: 4.65 x10E6/uL (ref 3.77–5.28)
RDW: 14.2 % (ref 12.3–15.4)
WBC: 7.5 10*3/uL (ref 3.4–10.8)

## 2015-11-08 LAB — VALPROIC ACID LEVEL: VALPROIC ACID LVL: 63 ug/mL (ref 50–100)

## 2015-11-08 LAB — PHENYTOIN LEVEL, TOTAL: PHENYTOIN (DILANTIN), SERUM: 13.6 ug/mL (ref 10.0–20.0)

## 2015-11-08 NOTE — Telephone Encounter (Signed)
Spoke to pt and relayed that her lab results looked good.  She verbalized understanding.  Will be signing up for mychart. If she does not get these that way she may call back for copy to be mailed.

## 2015-11-08 NOTE — Telephone Encounter (Signed)
-----   Message from Nilda RiggsNancy Carolyn Martin, NP sent at 11/08/2015  8:07 AM EDT ----- Labs good please call the patient

## 2016-02-20 MED FILL — DILANTIN 30 MG CAPSULE: 30 | 90 days supply | Qty: 90 | Fill #0

## 2016-02-20 MED FILL — levETIRAcetam 500 MG TABS: 500 | 90 days supply | Qty: 180 | Fill #0

## 2016-02-20 MED FILL — PHENYTOIN SOD EXT 100 MG CA: 100 | 90 days supply | Qty: 360 | Fill #0

## 2016-02-20 MED FILL — DIVALPROEX SOD DR 500 MG TA: 500 | 90 days supply | Qty: 540 | Fill #0

## 2016-06-11 MED FILL — DILANTIN 30 MG CAPSULE: 30 | 90 days supply | Qty: 90 | Fill #1

## 2016-06-11 MED FILL — PHENYTOIN SOD EXT 100 MG CA: 100 | 90 days supply | Qty: 360 | Fill #1

## 2016-06-11 MED FILL — levETIRAcetam 500 MG TABS: 500 | 90 days supply | Qty: 180 | Fill #1

## 2016-06-11 MED FILL — DIVALPROEX SOD DR 500 MG TA: 500 | 90 days supply | Qty: 540 | Fill #1

## 2016-07-26 ENCOUNTER — Emergency Department (HOSPITAL_COMMUNITY): Payer: 59

## 2016-07-26 ENCOUNTER — Encounter (HOSPITAL_COMMUNITY): Payer: Self-pay | Admitting: Emergency Medicine

## 2016-07-26 ENCOUNTER — Emergency Department (HOSPITAL_COMMUNITY)
Admission: EM | Admit: 2016-07-26 | Discharge: 2016-07-26 | Disposition: A | Discharge status: Home / Self Care | Payer: 59 | Attending: Emergency Medicine | Admitting: Emergency Medicine

## 2016-07-26 DIAGNOSIS — Y929 Unspecified place or not applicable: Secondary | ICD-10-CM | POA: Diagnosis not present

## 2016-07-26 DIAGNOSIS — Y999 Unspecified external cause status: Secondary | ICD-10-CM | POA: Diagnosis not present

## 2016-07-26 DIAGNOSIS — Y9301 Activity, walking, marching and hiking: Secondary | ICD-10-CM | POA: Diagnosis not present

## 2016-07-26 DIAGNOSIS — W010XXA Fall on same level from slipping, tripping and stumbling without subsequent striking against object, initial encounter: Secondary | ICD-10-CM | POA: Diagnosis not present

## 2016-07-26 DIAGNOSIS — S6391XA Sprain of unspecified part of right wrist and hand, initial encounter: Secondary | ICD-10-CM | POA: Diagnosis not present

## 2016-07-26 DIAGNOSIS — S6991XA Unspecified injury of right wrist, hand and finger(s), initial encounter: Secondary | ICD-10-CM | POA: Diagnosis not present

## 2016-07-26 DIAGNOSIS — F17210 Nicotine dependence, cigarettes, uncomplicated: Secondary | ICD-10-CM | POA: Diagnosis not present

## 2016-07-26 NOTE — Discharge Instructions (Signed)
Ice and elevate your hand. ACE wrap for support and compression. Ibuprofen or tylenol for pain. Follow up as needed with a family doctor or orthopedics.

## 2016-07-26 NOTE — ED Provider Notes (Signed)
[facility]-EMERGENCY DEPT Provider Note   CSN: [id number] Arrival date & time: 07/26/16  4098     History   Chief Complaint Chief Complaint  Patient presents with  . Fall  . Hand Injury    HPI Diana Vega is a 60 y.o. female.  HPI Diana Vega is a 61 y.o. female with hx of seizures presents to ED with complaint of a fall. Pt states she was taking her dog out, tripped over a leash and fell down injuring her right hand. States felt like her fingers bent backwards. Denies numbness or weakness to the fingers. No wrist pain. No head injury. No other complaints. Reports pain and swelling to the hand, specifically over index and middle finger. Reports pain with movement, but states able to move it. Applied ice pack prior to coming in, no other treatment   Past Medical History:  Diagnosis Date  . Meningitis   . Seizure [facility])     Patient Active Problem List   Diagnosis Date Noted  . 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 Encounter for therapeutic drug monitoring 09/28/2013  . ABNORMAL MAMMOGRAM, RIGHT BREAST 02/04/2010  . GERD 01/27/2010  . KNEE PAIN, RIGHT 01/27/2010  . Convulsions (HCC) 01/27/2010  . MENINGITIS, HX OF 01/27/2010  . CHICKENPOX, HX OF 01/27/2010    Past Surgical History:  Procedure Laterality Date  . CHOLECYSTECTOMY      OB History    No data available       Home Medications    Prior to Admission medications   Medication Sig Start Date End Date Taking? Authorizing Provider  divalproex (DEPAKOTE) 500 MG DR tablet Take 3 tablets (1,500 mg total) by mouth 2 (two) times daily. 11/07/15  Yes Nilda Riggs, NP  levETIRAcetam (KEPPRA) 500 MG tablet Take 1 tablet (500 mg total) by mouth 2 (two) times daily. 11/07/15  Yes Nilda Riggs, NP  phenytoin (DILANTIN) 100 MG ER capsule Take 2 capsules (200 mg total) by mouth 2 (two) times daily. 11/07/15  Yes Nilda Riggs, NP  phenytoin (DILANTIN) 30 MG ER capsule Take 1 capsule  (30 mg total) by mouth at bedtime. 11/07/15  Yes Nilda Riggs, NP    Family History Family History  Problem Relation Age of Onset  . Cancer Mother   . Cancer Father     Breast  . Cancer Brother     Prostate  . Cancer Brother     Prostate    Social History Social History  Substance Use Topics  . Smoking status: Current Every Day Smoker    Packs/day: 0.50    Types: Cigarettes  . Smokeless tobacco: Never Used  . Alcohol use No     Allergies   Dilaudid [hydromorphone hcl]; Codeine; Eggs or egg-derived products; Hydrocodone-acetaminophen; and Toradol [ketorolac tromethamine]   Review of Systems Review of Systems  Musculoskeletal: Positive for arthralgias and joint swelling.  Neurological: Negative for dizziness, weakness, light-headedness, numbness and headaches.  All other systems reviewed and are negative.    Physical Exam Updated Vital Signs BP 127/68 (BP Location: Left Arm)   Pulse 80   Temp 98.4 F (36.9 C) (Oral)   Resp 18   SpO2 99%   Physical Exam  Constitutional: She appears well-developed and well-nourished. No distress.  Eyes: Conjunctivae are normal.  Neck: Neck supple.  Musculoskeletal:  Swelling and bruising noted over dorsal right hand, specifically over 2nd and 3rd MCP joints. Normal hand otherwise. TTP over these joints. Pain with  ROM of the fingers at these two joints but full ROM with strength intact with flexion and extension. Cap refill <2sec distally. Normal elbow and wrist. Distal radial pulses intact.   Neurological: She is alert.  Skin: Skin is warm and dry.  Nursing note and vitals reviewed.    ED Treatments / Results  Labs (all labs ordered are listed, but only abnormal results are displayed) Labs Reviewed - No data to display  EKG  EKG Interpretation None       Radiology Dg Hand Complete Right  Result Date: 07/26/2016 CLINICAL DATA:  Initial evaluation for acute trauma, fall. EXAM: RIGHT HAND - COMPLETE 3+ VIEW  COMPARISON:  None available. FINDINGS: No acute fracture or dislocation. 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 Cystic lucencies at the lunate also consistent with degenerative changes. Osseous mineralization normal. No appreciable soft tissue swelling. IMPRESSION: No acute osseous abnormality about the right hand. Electronically Signed   By: Rise MuBenjamin  McClintock M.D.   On: 07/26/2016 06:54    Procedures Procedures (including critical care time)  Medications Ordered in ED Medications - No data to display   Initial Impression / Assessment and Plan / ED Course  I have reviewed the triage vital signs and the nursing notes.  Pertinent labs & imaging results that were available during my care of the patient were reviewed by me and considered in my medical decision making (see chart for details).     Pt in ED with right hand injury after a mechanical fall. Xray negative. No wrist pain or tenderness. No other injuries. Given the mechanism of injury and exam, most likely a sprain of 2nd and 3rd MCP joints. Strength intact. Neurovascularly intact. Plan to dc home with R.I.C.E. Therapy and follow up as needed.  ACE wrap provided in ED.   Vitals:   07/26/16 0630 07/26/16 0631  BP: 127/68 127/68  Pulse: 83 80  Resp:  18  Temp:  98.4 F (36.9 C)  TempSrc:  Oral  SpO2: 99% 99%     Final Clinical Impressions(s) / ED Diagnoses   Final diagnoses:  Hand sprain, right, initial encounter    New Prescriptions New Prescriptions   No medications on file     Jaynie Crumbleatyana Joycelyn Liska, PA-C 07/26/16 0720    Charlynne Panderavid Hsienta Yao, MD 07/26/16 2252

## 2016-07-26 NOTE — ED Triage Notes (Signed)
Pt coming from home. Pt states that she was walking up the steps with her dog and fell. When pt tried to catch herself all of her fingers bent back. Pt denies hearing a pop. Pt does not have full range of motions. Pt radial pulse 2+. Pt came into the ER with ice already in place.

## 2016-09-25 MED FILL — DILANTIN 30 MG CAPSULE: 30 | 90 days supply | Qty: 90 | Fill #2

## 2016-09-25 MED FILL — DIVALPROEX SOD DR 500 MG TA: 500 | 90 days supply | Qty: 540 | Fill #2

## 2016-09-25 MED FILL — PHENYTOIN SOD EXT 100 MG CA: 100 | 90 days supply | Qty: 360 | Fill #2

## 2016-09-25 MED FILL — levETIRAcetam 500 MG TABS: 500 | 90 days supply | Qty: 180 | Fill #2

## 2016-11-12 ENCOUNTER — Ambulatory Visit (INDEPENDENT_AMBULATORY_CARE_PROVIDER_SITE_OTHER): Payer: 59 | Admitting: Nurse Practitioner

## 2016-11-12 ENCOUNTER — Encounter: Payer: Self-pay | Admitting: *Deleted

## 2016-11-12 ENCOUNTER — Encounter: Payer: Self-pay | Admitting: Nurse Practitioner

## 2016-11-12 VITALS — BP 122/78 | HR 72 | Ht 62.0 in | Wt 205.4 lb

## 2016-11-12 DIAGNOSIS — G40309 Generalized idiopathic epilepsy and epileptic syndromes, not intractable, without status epilepticus: Secondary | ICD-10-CM | POA: Diagnosis not present

## 2016-11-12 DIAGNOSIS — Z5181 Encounter for therapeutic drug level monitoring: Secondary | ICD-10-CM

## 2016-11-12 MED ORDER — LEVETIRACETAM 500 MG PO TABS: 500.0000 mg | ORAL_TABLET | Freq: Two times a day (BID) | ORAL | 3 refills | Status: DC

## 2016-11-12 NOTE — Progress Notes (Signed)
I agree with the assessment and plan as directed by NP .The patient is known to me .   Julita Ozbun, MD  

## 2016-11-12 NOTE — Progress Notes (Signed)
[facility]  PATIENT: Diana Vega DOB: 30-Aug-1955   REASON FOR VISIT: Follow-up for generalized convulsive epilepsy HISTORY FROM: Patient    HISTORY OF PRESENT ILLNESS:Diana Vega, 61 year old female returns for yearly followup.   She has history of seizure disorder . Has been doing well.  Has noted petit mal sz when has problem taking depakote at night on an empty stomach. This is rare.    No generalized sz. She is currently on Dilantin Keppra and Depakote.  She tripped over the dog and sprained her right wrist several months ago .She returns today for followup. She needs refills and labs  HISTORY: She has been followed at [facility] since 2003, when she moved to the area from [place], for epilepsy with brief "petit mal" and very rare convulsive seizures. She was initially on Dilantin, Depakote, and phenobarbital. She was tapered off phenobarbital in 2006, had some breakthrough "small" seizures, and at that time was placed on Keppra. 3 sz Dec, May, June- 2007 each assoc w/ missed doses, first 2 w/ illness- May sz had rib fx, better now- post-ictal confusion- most recent sz had stopped Dilantin 4d earlier due to "tiredness"- did feel more awake prior to sz, now back- has failed Neurontin, Lamictal in past-    REVIEW OF SYSTEMS: Full 14 system review of systems performed and notable only for those listed, all others are neg:  Constitutional: neg  Cardiovascular: neg Ear/Nose/Throat: neg  Skin: neg Eyes: neg Respiratory: neg Gastroitestinal: neg  Hematology/Lymphatic: neg  Endocrine: neg Musculoskeletal:neg Allergy/Immunology: Food allergies Neurological: History of seizures Psychiatric: neg Sleep : neg   ALLERGIES: Allergies  Allergen Reactions  . Dilaudid [Hydromorphone Hcl]     acute renal failure   . Codeine     REACTION: GI upsets  . Eggs Or Egg-Derived Products   . Hydrocodone-Acetaminophen     REACTION: GI upsets  . Toradol [Ketorolac  Tromethamine]     ? Acute renal failure.    HOME MEDICATIONS: Outpatient Medications Prior to Visit  Medication Sig Dispense Refill  . divalproex (DEPAKOTE) 500 MG DR tablet Take 3 tablets (1,500 mg total) by mouth 2 (two) times daily. 540 tablet 3  . levETIRAcetam (KEPPRA) 500 MG tablet Take 1 tablet (500 mg total) by mouth 2 (two) times daily. 180 tablet 3  . phenytoin (DILANTIN) 100 MG ER capsule Take 2 capsules (200 mg total) by mouth 2 (two) times daily. 360 capsule 3  . phenytoin (DILANTIN) 30 MG ER capsule Take 1 capsule (30 mg total) by mouth at bedtime. 90 capsule 3   No facility-administered medications prior to visit.     PAST MEDICAL HISTORY: Past Medical History:  Diagnosis Date  . Meningitis   . Seizure (HCC)     PAST SURGICAL HISTORY: Past Surgical History:  Procedure Laterality Date  . CHOLECYSTECTOMY      FAMILY HISTORY: Family History  Problem Relation Age of Onset  . Cancer Mother   . Cancer Father        Breast  . Cancer Brother        Prostate  . Kidney failure Brother   . Hepatitis C Brother   . Cancer Brother        Prostate    SOCIAL HISTORY: Social History   Social History  . Marital status: Married    Spouse name: N/A  . Number of children: 4  . Years of education: College   Occupational History  . RN [employer]   Social  History Main Topics  . Smoking status: Current Every Day Smoker    Packs/day: 0.50    Types: Cigarettes  . Smokeless tobacco: Never Used  . Alcohol use No  . Drug use: No  . Sexual activity: Not on file   Other Topics Concern  . Not on file   Social History Narrative   Patient lives at home with her husband.    Patient has 4 children.    Patient drinks 3 cups of caffeine daily.    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      PHYSICAL EXAM  Vitals:   11/12/16 1101  BP: 122/78  Pulse: 72  Weight: 205 lb 6.4 oz (93.2 kg)  Height: 5\' 2"  (1.575 m)   Body mass index is 37.57 kg/m. Generalized:  Well developed, Obese female in no acute distress  Head: normocephalic and atraumatic,. Oropharynx benign  Neck: Supple,  Musculoskeletal: No deformity   Neurological examination   Mentation: Alert oriented to time, place, history taking. Follows all commands speech and language fluent  Cranial nerve II-XII: Pupils were equal round reactive to light extraocular movements were full, visual field were full on confrontational test. Facial sensation and strength were normal. hearing was intact to finger rubbing bilaterally. Uvula tongue midline. head turning and shoulder shrug were normal and symmetric.Tongue protrusion into cheek strength was normal. Motor: normal bulk and tone, full strength in the BUE, BLE, No focal weakness Coordination: finger-nose-finger, heel-to-shin bilaterally, no dysmetria Reflexes: 1+ upper lower and symmetric, plantar responses were flexor bilaterally. Gait and Station: Rising up from seated position without assistance, normal stance, moderate stride, good arm swing, smooth turning, able to perform tiptoe, and heel walking without difficulty. Tandem gait is steady DIAGNOSTIC DATA (LABS, IMAGING, TESTING) -   ASSESSMENT AND PLAN 61 y.o. year old female has a past medical history of Seizure here to follow-up. She is currently on Depakote, Dilantin, and Keppra. Last  seizure was due to missing a dose of her medications in March 2017     Continue Depakote at current dose will refill when labs back  Continue Dilantin at current dose will refill will refill when labs back  Continue Keppra at current dose will refill Check labs today, CBC, CMP, to monitor adverse effects of seizure medications  Dilantin and VPA level to monitor for therapeutic level Follow-up yearly and when necessary Nilda RiggsNancy Carolyn Martin, [facility]KiskiGNP, [facility] CenterBC, APRN  [facility] [address], [address] [place], [place] [address] [phone]

## 2016-11-12 NOTE — Patient Instructions (Signed)
Continue Depakote at current dose will refill Continue Dilantin at current dose will refill Continue Keppra at current dose will refill Check labs today, CBC, CMP, to monitor adverse effects of seizure medications  Dilantin and VPA level to monitor for therapeutic level Follow-up yearly and when necessary

## 2016-11-12 NOTE — Telephone Encounter (Signed)
Error

## 2016-11-13 ENCOUNTER — Telehealth: Payer: Self-pay | Admitting: *Deleted

## 2016-11-13 ENCOUNTER — Other Ambulatory Visit: Payer: Self-pay | Admitting: Nurse Practitioner

## 2016-11-13 LAB — CBC WITH DIFFERENTIAL/PLATELET
Basophils Absolute: 0 10*3/uL (ref 0.0–0.2)
Basos: 0 %
EOS (ABSOLUTE): 0.4 10*3/uL (ref 0.0–0.4)
EOS: 5 %
HEMATOCRIT: 41 % (ref 34.0–46.6)
HEMOGLOBIN: 13.8 g/dL (ref 11.1–15.9)
IMMATURE GRANULOCYTES: 0 %
Immature Grans (Abs): 0 10*3/uL (ref 0.0–0.1)
LYMPHS: 45 %
Lymphocytes Absolute: 3.3 10*3/uL — ABNORMAL HIGH (ref 0.7–3.1)
MCH: 30.5 pg (ref 26.6–33.0)
MCHC: 33.7 g/dL (ref 31.5–35.7)
MCV: 91 fL (ref 79–97)
MONOCYTES: 10 %
Monocytes Absolute: 0.8 10*3/uL (ref 0.1–0.9)
NEUTROS PCT: 40 %
Neutrophils Absolute: 3 10*3/uL (ref 1.4–7.0)
Platelets: 237 10*3/uL (ref 150–379)
RBC: 4.52 x10E6/uL (ref 3.77–5.28)
RDW: 14.5 % (ref 12.3–15.4)
WBC: 7.5 10*3/uL (ref 3.4–10.8)

## 2016-11-13 LAB — COMPREHENSIVE METABOLIC PANEL
ALBUMIN: 3.9 g/dL (ref 3.6–4.8)
ALK PHOS: 97 IU/L (ref 39–117)
ALT: 10 IU/L (ref 0–32)
AST: 16 IU/L (ref 0–40)
Albumin/Globulin Ratio: 1.6 (ref 1.2–2.2)
BUN/Creatinine Ratio: 22 (ref 12–28)
BUN: 13 mg/dL (ref 8–27)
Bilirubin Total: <0.2 mg/dL (ref 0.0–1.2)
CO2: 25 mmol/L (ref 20–29)
CREATININE: 0.58 mg/dL (ref 0.57–1.00)
Calcium: 9.2 mg/dL (ref 8.7–10.3)
Chloride: 104 mmol/L (ref 96–106)
GFR calc Af Amer: 116 mL/min/{1.73_m2} (ref >59)
GFR calc non Af Amer: 101 mL/min/{1.73_m2} (ref >59)
Globulin, Total: 2.5 g/dL (ref 1.5–4.5)
Glucose: 88 mg/dL (ref 65–99)
Potassium: 5.1 mmol/L (ref 3.5–5.2)
Sodium: 143 mmol/L (ref 134–144)
Total Protein: 6.4 g/dL (ref 6.0–8.5)

## 2016-11-13 LAB — VALPROIC ACID LEVEL: VALPROIC ACID LVL: 76 ug/mL (ref 50–100)

## 2016-11-13 LAB — PHENYTOIN LEVEL, TOTAL: PHENYTOIN (DILANTIN), SERUM: 8.4 ug/mL — AB (ref 10.0–20.0)

## 2016-11-13 MED ORDER — PHENYTOIN SODIUM EXTENDED 30 MG PO CAPS: 30.0000 mg | ORAL_CAPSULE | Freq: Every day | ORAL | 3 refills | Status: DC

## 2016-11-13 MED ORDER — DIVALPROEX SODIUM 500 MG PO DR TAB: 1500.0000 mg | DELAYED_RELEASE_TABLET | Freq: Two times a day (BID) | ORAL | 3 refills | Status: DC

## 2016-11-13 MED ORDER — PHENYTOIN SODIUM EXTENDED 100 MG PO CAPS: 200.0000 mg | ORAL_CAPSULE | Freq: Two times a day (BID) | ORAL | 3 refills | Status: DC

## 2016-11-13 NOTE — Telephone Encounter (Signed)
Spoke with patient and informed her that her Dilantin level is 8.4, but she has a good level of VPA at 76. Advised her to continue all her current medications as prescribed. Advised she call for problems. Patient verbalized understanding, appreciation.

## 2017-01-12 MED FILL — DILANTIN 30 MG CAPSULE: 30 | 90 days supply | Qty: 90 | Fill #0

## 2017-01-12 MED FILL — levETIRAcetam 500 MG TABS: 500 | 90 days supply | Qty: 180 | Fill #0

## 2017-01-12 MED FILL — DIVALPROEX SOD DR 500 MG TA: 500 | 90 days supply | Qty: 540 | Fill #0

## 2017-01-12 MED FILL — PHENYTOIN SOD EXT 100 MG CA: 100 | 90 days supply | Qty: 360 | Fill #0

## 2017-02-08 MED FILL — AMOXICILLIN 500 MG CAPSULE: 500 | 5 days supply | Qty: 20 | Fill #0

## 2017-02-08 MED FILL — IBUPROFEN 800 MG TABS: 800 | 8 days supply | Qty: 20 | Fill #0

## 2017-02-12 MED FILL — AMOXICILLIN 500 MG CAPSULE: 500 | 3 days supply | Qty: 9 | Fill #0

## 2017-04-23 MED FILL — DILANTIN 30 MG CAPSULE: 30 | 90 days supply | Qty: 90 | Fill #1

## 2017-04-23 MED FILL — levETIRAcetam 500 MG TABS: 500 | 90 days supply | Qty: 180 | Fill #1

## 2017-04-23 MED FILL — DIVALPROEX SOD DR 500 MG TA: 500 | 90 days supply | Qty: 540 | Fill #1

## 2017-04-23 MED FILL — PHENYTOIN SOD EXT 100 MG CA: 100 | 90 days supply | Qty: 360 | Fill #1

## 2017-08-05 MED FILL — DILANTIN 30 MG CAPSULE: 30 | 90 days supply | Qty: 90 | Fill #2

## 2017-08-05 MED FILL — levETIRAcetam 500 MG TABS: 500 | 90 days supply | Qty: 180 | Fill #2

## 2017-08-05 MED FILL — DIVALPROEX SOD DR 500 MG TA: 500 | 90 days supply | Qty: 540 | Fill #2

## 2017-08-05 MED FILL — PHENYTOIN SOD EXT 100 MG CA: 100 | 90 days supply | Qty: 360 | Fill #2

## 2017-11-11 ENCOUNTER — Other Ambulatory Visit: Payer: Self-pay

## 2017-11-11 ENCOUNTER — Emergency Department (HOSPITAL_COMMUNITY): Payer: 59

## 2017-11-11 ENCOUNTER — Emergency Department (HOSPITAL_COMMUNITY)
Admission: EM | Admit: 2017-11-11 | Discharge: 2017-11-11 | Disposition: A | Discharge status: Home / Self Care | Payer: 59 | Attending: Emergency Medicine | Admitting: Emergency Medicine

## 2017-11-11 ENCOUNTER — Encounter (HOSPITAL_COMMUNITY): Payer: Self-pay | Admitting: Emergency Medicine

## 2017-11-11 DIAGNOSIS — F17210 Nicotine dependence, cigarettes, uncomplicated: Secondary | ICD-10-CM | POA: Diagnosis not present

## 2017-11-11 DIAGNOSIS — Z79899 Other long term (current) drug therapy: Secondary | ICD-10-CM | POA: Diagnosis not present

## 2017-11-11 DIAGNOSIS — R0789 Other chest pain: Secondary | ICD-10-CM | POA: Insufficient documentation

## 2017-11-11 DIAGNOSIS — R0781 Pleurodynia: Secondary | ICD-10-CM | POA: Diagnosis not present

## 2017-11-11 DIAGNOSIS — S299XXA Unspecified injury of thorax, initial encounter: Secondary | ICD-10-CM | POA: Diagnosis not present

## 2017-11-11 DIAGNOSIS — S2231XA Fracture of one rib, right side, initial encounter for closed fracture: Secondary | ICD-10-CM | POA: Diagnosis not present

## 2017-11-11 DIAGNOSIS — R911 Solitary pulmonary nodule: Secondary | ICD-10-CM | POA: Diagnosis not present

## 2017-11-11 DIAGNOSIS — R109 Unspecified abdominal pain: Secondary | ICD-10-CM | POA: Diagnosis not present

## 2017-11-11 DIAGNOSIS — R1011 Right upper quadrant pain: Secondary | ICD-10-CM | POA: Diagnosis not present

## 2017-11-11 DIAGNOSIS — R569 Unspecified convulsions: Secondary | ICD-10-CM | POA: Diagnosis not present

## 2017-11-11 DIAGNOSIS — G40909 Epilepsy, unspecified, not intractable, without status epilepticus: Secondary | ICD-10-CM | POA: Diagnosis not present

## 2017-11-11 DIAGNOSIS — S3991XA Unspecified injury of abdomen, initial encounter: Secondary | ICD-10-CM | POA: Diagnosis not present

## 2017-11-11 LAB — CBC WITH DIFFERENTIAL/PLATELET
ABS IMMATURE GRANULOCYTES: 0.1 10*3/uL (ref 0.0–0.1)
BASOS ABS: 0 10*3/uL (ref 0.0–0.1)
Basophils Relative: 0 %
Eosinophils Absolute: 0.1 10*3/uL (ref 0.0–0.7)
Eosinophils Relative: 1 %
HCT: 40.4 % (ref 36.0–46.0)
HEMOGLOBIN: 13.4 g/dL (ref 12.0–15.0)
IMMATURE GRANULOCYTES: 1 %
LYMPHS ABS: 2.8 10*3/uL (ref 0.7–4.0)
LYMPHS PCT: 27 %
MCH: 30.7 pg (ref 26.0–34.0)
MCHC: 33.2 g/dL (ref 30.0–36.0)
MCV: 92.4 fL (ref 78.0–100.0)
Monocytes Absolute: 0.9 10*3/uL (ref 0.1–1.0)
Monocytes Relative: 8 %
NEUTROS ABS: 6.8 10*3/uL (ref 1.7–7.7)
Neutrophils Relative %: 63 %
Platelets: 241 10*3/uL (ref 150–400)
RBC: 4.37 MIL/uL (ref 3.87–5.11)
RDW: 13 % (ref 11.5–15.5)
WBC: 10.7 10*3/uL — AB (ref 4.0–10.5)

## 2017-11-11 LAB — COMPREHENSIVE METABOLIC PANEL
ALBUMIN: 3.4 g/dL — AB (ref 3.5–5.0)
ALT: 84 U/L — AB (ref 0–44)
ANION GAP: 10 (ref 5–15)
AST: 108 U/L — AB (ref 15–41)
Alkaline Phosphatase: 84 U/L (ref 38–126)
BUN: 18 mg/dL (ref 8–23)
CO2: 24 mmol/L (ref 22–32)
Calcium: 8.7 mg/dL — ABNORMAL LOW (ref 8.9–10.3)
Chloride: 105 mmol/L (ref 98–111)
Creatinine, Ser: 0.55 mg/dL (ref 0.44–1.00)
GFR calc Af Amer: >60 mL/min (ref >60)
GFR calc non Af Amer: >60 mL/min (ref >60)
GLUCOSE: 110 mg/dL — AB (ref 70–99)
Potassium: 4.4 mmol/L (ref 3.5–5.1)
SODIUM: 139 mmol/L (ref 135–145)
Total Bilirubin: 0.5 mg/dL (ref 0.3–1.2)
Total Protein: 6 g/dL — ABNORMAL LOW (ref 6.5–8.1)

## 2017-11-11 LAB — VALPROIC ACID LEVEL: Valproic Acid Lvl: 45 ug/mL — ABNORMAL LOW (ref 50.0–100.0)

## 2017-11-11 MED ORDER — DIVALPROEX SODIUM 250 MG PO DR TAB
1500.0000 mg | DELAYED_RELEASE_TABLET | Freq: Once | ORAL | Status: DC
  Filled 2017-11-11: qty 6

## 2017-11-11 MED ORDER — ONDANSETRON 4 MG PO TBDP: 4.0000 mg | ORAL_TABLET | Freq: Three times a day (TID) | ORAL | 0 refills | Status: DC | PRN

## 2017-11-11 MED ORDER — IOHEXOL 300 MG/ML  SOLN
100.0000 mL | Freq: Once | INTRAMUSCULAR | Status: AC | PRN
  Administered 2017-11-11: 100 mL via INTRAVENOUS

## 2017-11-11 MED ORDER — MORPHINE SULFATE (PF) 4 MG/ML IV SOLN
4.0000 mg | Freq: Once | INTRAVENOUS | Status: AC
  Administered 2017-11-11: 4 mg via INTRAVENOUS
  Filled 2017-11-11: qty 1

## 2017-11-11 MED ORDER — GI COCKTAIL ~~LOC~~
30.0000 mL | Freq: Once | ORAL | Status: AC
  Administered 2017-11-11: 30 mL via ORAL
  Filled 2017-11-11: qty 30

## 2017-11-11 MED ORDER — HYDROCODONE-ACETAMINOPHEN 5-325 MG PO TABS: 1.0000 | ORAL_TABLET | Freq: Four times a day (QID) | ORAL | 0 refills | Status: DC | PRN

## 2017-11-11 MED ORDER — ONDANSETRON HCL 4 MG/2ML IJ SOLN
4.0000 mg | Freq: Once | INTRAMUSCULAR | Status: AC
  Administered 2017-11-11: 4 mg via INTRAVENOUS
  Filled 2017-11-11: qty 2

## 2017-11-11 MED FILL — HYDROCODON-APAP 5-325: 5-325 | 4 days supply | Qty: 15 | Fill #0

## 2017-11-11 MED FILL — ONDANSETRON ODT 4 MG TABLET: 4 | 7 days supply | Qty: 20 | Fill #0

## 2017-11-11 NOTE — Progress Notes (Signed)
RT instructed pt on the use of incentive spirometer.  Pt able to reach 2000 mL with good technique.

## 2017-11-11 NOTE — ED Triage Notes (Signed)
Pt reports a seizure about an 2 hours ago, states she fell out of the chair and now has flank pain on the right side from the fall.  Pt reports she has taken her medications.

## 2017-11-11 NOTE — ED Provider Notes (Signed)
Patient seen/examined in the Emergency Department in conjunction with Midlevel Provider Joy Patient reports recent seizure, fell, and has chest wall pain and abdominal pain Exam : Awake alert, no distress, has chest wall tenderness. Also reports heartburn which she gets frequently Plan: Imaging pending at this time.  I did offer GI cocktail for her heartburn   Zadie RhineWickline, Aleece Loyd, MD 11/11/17 [phone]

## 2017-11-11 NOTE — ED Notes (Signed)
Pt reports a seizure with a fall last evening, 11/10/17, at 2300hrs. Pt fell on her right chest wall/flank area and feels she might have broken some ribs.

## 2017-11-11 NOTE — ED Provider Notes (Signed)
[facility] EMERGENCY DEPARTMENT Provider Note   CSN: [id number] Arrival date & time: 11/11/17  0129     History   Chief Complaint Chief Complaint  Patient presents with  . Seizures  . Fall    HPI Diana Vega is a 62 y.o. female.  HPI   Diana Vega is a 62 y.o. female, with a history of seizures, presenting to the ED with seizure that occurred around 76 PM on July 10.  Seizure was unwitnessed.  She states she fell out of the chair and is now complaining of pain to the right ribs.  Her last seizure was approximately 3 months ago.  She is followed by [facility].  Voices compliance with her medications. Denies alcohol or illicit drug use. Denies shortness of breath, chest pain, vomiting, diarrhea, headache, neck/back pain, numbness, weakness, fever, or any other complaints.     Past Medical History:  Diagnosis Date  . Meningitis   . Seizure [facility])     Patient Active Problem List   Diagnosis Date Noted  . 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 Encounter for therapeutic drug monitoring 09/28/2013  . ABNORMAL MAMMOGRAM, RIGHT BREAST 02/04/2010  . GERD 01/27/2010  . KNEE PAIN, RIGHT 01/27/2010  . Convulsions (HCC) 01/27/2010  . MENINGITIS, HX OF 01/27/2010  . CHICKENPOX, HX OF 01/27/2010    Past Surgical History:  Procedure Laterality Date  . CHOLECYSTECTOMY       OB History   None      Home Medications    Prior to Admission medications   Medication Sig Start Date End Date Taking? Authorizing Provider  divalproex (DEPAKOTE) 500 MG DR tablet Take 3 tablets (1,500 mg total) by mouth 2 (two) times daily. 11/13/16  Yes Nilda Riggs, NP  ibuprofen (ADVIL,MOTRIN) 200 MG tablet Take 600 mg by mouth every 6 (six) hours as needed for headache or mild pain.   Yes [provider]  levETIRAcetam (KEPPRA) 500 MG tablet Take 1 tablet (500 mg total) by mouth 2 (two) times daily. 11/12/16  Yes  Nilda Riggs, NP  phenytoin (DILANTIN) 100 MG ER capsule Take 2 capsules (200 mg total) by mouth 2 (two) times daily. 11/13/16  Yes Nilda Riggs, NP  phenytoin (DILANTIN) 30 MG ER capsule Take 1 capsule (30 mg total) by mouth at bedtime. 11/13/16  Yes Nilda Riggs, NP  HYDROcodone-acetaminophen (NORCO/VICODIN) 5-325 MG tablet Take 1 tablet by mouth every 6 (six) hours as needed for severe pain. 11/11/17   Davinia Riccardi C, PA-C  ondansetron (ZOFRAN ODT) 4 MG disintegrating tablet Take 1 tablet (4 mg total) by mouth every 8 (eight) hours as needed for nausea or vomiting. 11/11/17   Cheryln Balcom, Hillard Danker, PA-C    Family History Family History  Problem Relation Age of Onset  . Cancer Mother   . Cancer Father        Breast  . Cancer Brother        Prostate  . Kidney failure Brother   . Hepatitis C Brother   . Cancer Brother        Prostate    Social History Social History   Tobacco Use  . Smoking status: Current Every Day Smoker    Packs/day: 0.50    Types: Cigarettes  . Smokeless tobacco: Never Used  Substance Use Topics  . Alcohol use: No    Alcohol/week: 0.0 oz  . Drug use: No     Allergies  Dilaudid [hydromorphone hcl]; Codeine; Eggs or egg-derived products; Hydrocodone-acetaminophen; and Toradol [ketorolac tromethamine]   Review of Systems Review of Systems  Constitutional: Negative for chills, diaphoresis and fever.  Respiratory: Positive for cough. Negative for shortness of breath.   Cardiovascular: Negative for chest pain.  Gastrointestinal: Negative for abdominal pain, diarrhea, nausea and vomiting.  Musculoskeletal: Negative for back pain and neck pain.  Neurological: Positive for seizures. Negative for dizziness, weakness, light-headedness, numbness and headaches.  All other systems reviewed and are negative.    Physical Exam Updated Vital Signs BP 111/70 (BP Location: Right Arm)   Pulse 94   Temp 98.5 F (36.9 C) (Oral)   Ht 5\' 2"  (1.575 m)    Wt 88 kg (194 lb)   SpO2 98%   BMI 35.48 kg/m   Physical Exam  Constitutional: She is oriented to person, place, and time. She appears well-developed and well-nourished. She appears distressed (pain).  HENT:  Head: Normocephalic and atraumatic.  Mouth/Throat: Oropharynx is clear and moist.  Eyes: Pupils are equal, round, and reactive to light. Conjunctivae and EOM are normal.  Neck: Normal range of motion. Neck supple.  Cardiovascular: Normal rate, regular rhythm, normal heart sounds and intact distal pulses.  Pulmonary/Chest: Effort normal and breath sounds normal. No respiratory distress.  No noted crepitus, instability, deformity, swelling, or color change to the chest wall.    Abdominal: Soft. There is no tenderness. There is no guarding.    Musculoskeletal: She exhibits tenderness. She exhibits no edema.  Normal motor function intact in all extremities and spine. No midline spinal tenderness.   Lymphadenopathy:    She has no cervical adenopathy.  Neurological: She is alert and oriented to person, place, and time.  Sensation grossly intact to light touch in all four extremities. Strength 5/5 in all extremities. No gait disturbance. Coordination intact. Cranial nerves III-XII grossly intact. No facial droop.   Skin: Skin is warm and dry. She is not diaphoretic.  Psychiatric: She has a normal mood and affect. Her behavior is normal.  Nursing note and vitals reviewed.    ED Treatments / Results  Labs (all labs ordered are listed, but only abnormal results are displayed) Labs Reviewed  CBC WITH DIFFERENTIAL/PLATELET - Abnormal; Notable for the following components:      Result Value   WBC 10.7 (*)    All other components within normal limits  COMPREHENSIVE METABOLIC PANEL - Abnormal; Notable for the following components:   Glucose, Bld 110 (*)    Calcium 8.7 (*)    Total Protein 6.0 (*)    Albumin 3.4 (*)    AST 108 (*)    ALT 84 (*)    All other components within  normal limits  VALPROIC ACID LEVEL - Abnormal; Notable for the following components:   Valproic Acid Lvl 45 (*)    All other components within normal limits  LEVETIRACETAM LEVEL  PHENYTOIN LEVEL, FREE AND TOTAL  CBG MONITORING, ED   ALT  Date Value Ref Range Status  11/11/2017 84 (H) 0 - 44 U/L Final    Comment:    Please note change in reference range.  11/12/2016 10 0 - 32 IU/L Final  11/07/2015 9 0 - 32 IU/L Final  10/02/2014 10 0 - 32 IU/L Final    AST  Date Value Ref Range Status  11/11/2017 108 (H) 15 - 41 U/L Final  11/12/2016 16 0 - 40 IU/L Final  11/07/2015 14 0 - 40 IU/L Final  10/02/2014 14  0 - 40 IU/L Final     EKG None  Radiology Dg Ribs Unilateral W/chest Right  Result Date: 11/11/2017 CLINICAL DATA:  Fall after seizure.  Struck RIGHT ribs on chair. EXAM: RIGHT RIBS AND CHEST - 3+ VIEW COMPARISON:  None. FINDINGS: No fracture or other bone lesions are seen involving the ribs. There is no evidence of pneumothorax or pleural effusion. Strandy densities LEFT lung base. Both lungs are otherwise clear. Heart size and mediastinal contours are within normal limits. Mildly calcified aortic arch. Coarse calcification LEFT breast. IMPRESSION: LEFT lung base atelectasis/scarring. No rib fracture deformity. Aortic Atherosclerosis (ICD10-I70.0). Electronically Signed   By: Awilda Metroourtnay  Bloomer M.D.   On: 11/11/2017 05:00    Procedures Procedures (including critical care time)  Medications Ordered in ED Medications  morphine 4 MG/ML injection 4 mg (has no administration in time range)  divalproex (DEPAKOTE) DR tablet 1,500 mg (has no administration in time range)  morphine 4 MG/ML injection 4 mg (4 mg Intravenous Given 11/11/17 0514)  ondansetron (ZOFRAN) injection 4 mg (4 mg Intravenous Given 11/11/17 0511)  gi cocktail (Maalox,Lidocaine,Donnatal) (30 mLs Oral Given 11/11/17 [id number])     Initial Impression / Assessment and Plan / ED Course  I have reviewed the triage vital  signs and the nursing notes.  Pertinent labs & imaging results that were available during my care of the patient were reviewed by me and considered in my medical decision making (see chart for details).  Clinical Course as of Nov 11 637  Thu Nov 11, 2017  0529 Patient's pain has improved significantly.  She no longer appears uncomfortable.  She states her pain is 0 at rest and 4/10 with deep breathing.   [SJ]  [phone] Suspect this may be due to patient needing her morning dose.  Valproic Acid,S(!): 45 [SJ]    Clinical Course User Index [SJ] Anselm PancoastJoy, Darlena Koval C, PA-C    Patient presents following an unwitnessed seizure.  She has no focal neuro deficits and is alert and oriented.  She does have some mildly elevated transaminases.  She will follow-up with PCP versus GI on this matter. Due to her areas of tenderness, combined with her elevated transaminases, she will have CT evaluation of the chest and abdomen.   End of shift patient care handoff report given to Glenford BayleyAlex Law, PA-C. Plan: CT chest, abdomen, and pelvis pending.  Spirometry discussed.  Patient to receive spirometer and instructions at bedside.  Patient to be discharged with pain management and antiemetic.  Findings and plan of care discussed with Zadie Rhineonald Wickline, MD. Dr. Bebe ShaggyWickline personally evaluated and examined this patient.  Vitals:   11/11/17 0515 11/11/17 0530 11/11/17 0545 11/11/17 0600  BP: (!) 108/57 (!) 101/44 122/74 126/76  Pulse: 74     Temp:      TempSrc:      SpO2: 98%     Weight:      Height:          Final Clinical Impressions(s) / ED Diagnoses   Final diagnoses:  Seizure (HCC)  Chest wall pain    ED Discharge Orders        Ordered    HYDROcodone-acetaminophen (NORCO/VICODIN) 5-325 MG tablet  Every 6 hours PRN     11/11/17 0626    ondansetron (ZOFRAN ODT) 4 MG disintegrating tablet  Every 8 hours PRN     11/11/17 0626       Anselm PancoastJoy, Shekira Drummer C, PA-C 11/11/17 0640    Zadie RhineWickline, Donald, MD 11/11/17  [phone]

## 2017-11-11 NOTE — ED Notes (Signed)
Pt reports her pain is a 3 resting and 10 with movement.

## 2017-11-11 NOTE — ED Provider Notes (Signed)
Signout from previous provider, Harolyn RutherfordShawn Joy, PA-C at shift change See previous provider note for full H&P  Briefly, hx of seizures. Seizure last night, hit right side during seizure. Rib x-ray is negative for fracture. Mild transaminase. Tenderness to the abdomen and abdomen. CTs of chest and abdomen/pelvis pending.  CT of the chest shows posterior fracture of the right 11th rib demonstrating one bone width of displacement, trace adjacent pleural effusion, no pneumothorax.  Patient also with 6mm subpleural nodule in the right middle lobe as well as a 3 x 4 mm nodule in the right lower lobe.  Follow-up CTs are recommended by the radiologist.  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  I made patient aware of this and she will follow-up and establish care with a primary care provider, which she does not have at this time, for these follow-up imaging recommendations.  Will discharge home with pain control and Zofran for rib fractures.  Patient has inspiratory spirometer.  Return precautions discussed.  Patient understands and agrees with plan.  Patient vitals stable throughout ED course and discharged in satisfactory condition.   Emi HolesLaw, Chella Chapdelaine M, PA-C 11/11/17 1635    Zadie RhineWickline, Donald, MD 11/11/17 [phone]

## 2017-11-11 NOTE — ED Notes (Signed)
Patient transported to CT 

## 2017-11-11 NOTE — Discharge Instructions (Addendum)
° °  Rib injuries  You have sustained a rib injury.    Please adhere to the following instructions: Incentive spirometer: This device is used to ensure proper expansion of the lungs and to help prevent secondary issues, such as pneumonia.  Think of this as physical therapy for your lungs while you are injured.  Perform lung expansion exercises every 1-2 hours while awake.  Have an initial goal of 1000 mL and then work to increase this value.  Antiinflammatory medications: Take 600 mg of ibuprofen every 6 hours or 440 mg (over the counter dose) to 500 mg (prescription dose) of naproxen every 12 hours for the next 3 days. After this time, these medications may be used as needed for pain. Take these medications with food to avoid upset stomach. Choose only one of these medications, do not take them together. Tylenol: Should you continue to have additional pain while taking the ibuprofen or naproxen, you may add in tylenol as needed. Your daily total maximum amount of tylenol from all sources should be limited to 2000mg /day for persons due to your elevated liver enzymes. Vicodin: May take Vicodin as needed for severe pain.  Do not drive or perform other dangerous activities while taking the Vicodin.  Please note that each pill of Vicodin contains 325 mg of Tylenol and the above dosage limits apply.  Other pain management: Many parts of pain management involve experimentation to find what works for you as an individual patient.  You may try lidocaine patches, topical pain relievers, or hot/cold packs.  You may also need to change your sleeping position.  This may involve sleeping propped up in a chair or with extra pillows.  Zofran: May take Zofran, as needed, for nausea.`  Duration of pain: For bruising or contusions to the ribs, pain can last 4-6 weeks.  For rib fractures, you can expect to have discomfort for 6-12 weeks.  It should be noted that even if there are no obvious fractures on the x-rays, you  may have what is called an occult fracture.  This simply means that the bone is broken, but is not broken enough to be noted on x-ray.  Follow-up: Please follow-up with a primary care provider for any further management of this issue.  Any further pain management should also be handled by a primary care provider.  Return: Return to the ED should you begin to have significantly worsening pain, onset of shortness of breath (not just hesitancy to take a deep breath due to pain), fever over 100.3 F accompanied by cough, coughing up blood, or any other major concerns.   Seizures: Continue to take your seizure medications, as prescribed.  Follow-up with your neurologist as soon as possible due to this breakthrough seizure.  Liver enzymes: Your liver enzymes are mildly elevated.  Follow-up with primary care provider or gastroenterologist on this matter.

## 2017-11-11 NOTE — Progress Notes (Deleted)
[facility]  PATIENT: Diana Vega DOB: May 14, 1955   REASON FOR VISIT: Follow-up for generalized convulsive epilepsy HISTORY FROM: Patient    HISTORY OF PRESENT ILLNESS:Diana Vega, 62 year old female returns for yearly followup.   She has history of seizure disorder . Has been doing well.  Has noted petit mal sz when has problem taking depakote at night on an empty stomach. This is rare.    No generalized sz. She is currently on Dilantin Keppra and Depakote.  She tripped over the dog and sprained her right wrist several months ago .She returns today for followup. She needs refills and labs  HISTORY: She has been followed at [facility] since 2003, when she moved to the area from [place], for epilepsy with brief "petit mal" and very rare convulsive seizures. She was initially on Dilantin, Depakote, and phenobarbital. She was tapered off phenobarbital in 2006, had some breakthrough "small" seizures, and at that time was placed on Keppra. 3 sz Dec, May, June- 2007 each assoc w/ missed doses, first 2 w/ illness- May sz had rib fx, better now- post-ictal confusion- most recent sz had stopped Dilantin 4d earlier due to "tiredness"- did feel more awake prior to sz, now back- has failed Neurontin, Lamictal in past-    REVIEW OF SYSTEMS: Full 14 system review of systems performed and notable only for those listed, all others are neg:  Constitutional: neg  Cardiovascular: neg Ear/Nose/Throat: neg  Skin: neg Eyes: neg Respiratory: neg Gastroitestinal: neg  Hematology/Lymphatic: neg  Endocrine: neg Musculoskeletal:neg Allergy/Immunology: Food allergies Neurological: History of seizures Psychiatric: neg Sleep : neg   ALLERGIES: Allergies  Allergen Reactions  . Dilaudid [Hydromorphone Hcl]     acute renal failure   . Codeine     REACTION: GI upsets  . Eggs Or Egg-Derived Products   . Hydrocodone-Acetaminophen     REACTION: GI upsets  . Toradol [Ketorolac  Tromethamine]     ? Acute renal failure.    HOME MEDICATIONS: Facility-Administered Medications Prior to Visit  Medication Dose Route Frequency Provider Last Rate Last Dose  . divalproex (DEPAKOTE) DR tablet 1,500 mg  1,500 mg Oral Once Joy, Shawn C, PA-C       Outpatient Medications Prior to Visit  Medication Sig Dispense Refill  . divalproex (DEPAKOTE) 500 MG DR tablet Take 3 tablets (1,500 mg total) by mouth 2 (two) times daily. 540 tablet 3  . HYDROcodone-acetaminophen (NORCO/VICODIN) 5-325 MG tablet Take 1 tablet by mouth every 6 (six) hours as needed for severe pain. 15 tablet 0  . ibuprofen (ADVIL,MOTRIN) 200 MG tablet Take 600 mg by mouth every 6 (six) hours as needed for headache or mild pain.    Marland Kitchen levETIRAcetam (KEPPRA) 500 MG tablet Take 1 tablet (500 mg total) by mouth 2 (two) times daily. 180 tablet 3  . ondansetron (ZOFRAN ODT) 4 MG disintegrating tablet Take 1 tablet (4 mg total) by mouth every 8 (eight) hours as needed for nausea or vomiting. 20 tablet 0  . phenytoin (DILANTIN) 100 MG ER capsule Take 2 capsules (200 mg total) by mouth 2 (two) times daily. 360 capsule 3  . phenytoin (DILANTIN) 30 MG ER capsule Take 1 capsule (30 mg total) by mouth at bedtime. 90 capsule 3    PAST MEDICAL HISTORY: Past Medical History:  Diagnosis Date  . Meningitis   . Seizure (HCC)     PAST SURGICAL HISTORY: Past Surgical History:  Procedure Laterality Date  . CHOLECYSTECTOMY      FAMILY  HISTORY: Family History  Problem Relation Age of Onset  . Cancer Mother   . Cancer Father        Breast  . Cancer Brother        Prostate  . Kidney failure Brother   . Hepatitis C Brother   . Cancer Brother        Prostate    SOCIAL HISTORY: Social History   Socioeconomic History  . Marital status: Married    Spouse name: Not on file  . Number of children: 4  . Years of education: College  . Highest education level: Not on file  Occupational History  . Occupation: Engineering geologist: [employer]  Social Needs  . Financial resource strain: Not on file  . Food insecurity:    Worry: Not on file    Inability: Not on file  . Transportation needs:    Medical: Not on file    Non-medical: Not on file  Tobacco Use  . Smoking status: Current Every Day Smoker    Packs/day: 0.50    Types: Cigarettes  . Smokeless tobacco: Never Used  Substance and Sexual Activity  . Alcohol use: No    Alcohol/week: 0.0 oz  . Drug use: No  . Sexual activity: Not on file  Lifestyle  . Physical activity:    Days per week: Not on file    Minutes per session: Not on file  . Stress: Not on file  Relationships  . Social connections:    Talks on phone: Not on file    Gets together: Not on file    Attends religious service: Not on file    Active member of club or organization: Not on file    Attends meetings of clubs or organizations: Not on file    Relationship status: Not on file  . Intimate partner violence:    Fear of current or ex partner: Not on file    Emotionally abused: Not on file    Physically abused: Not on file    Forced sexual activity: Not on file  Other Topics Concern  . Not on file  Social History Narrative   Patient lives at home with her husband.    Patient has 4 children.    Patient drinks 3 cups of caffeine daily.    Patient works as a Charity fundraiser for [employer].      PHYSICAL EXAM  There were no vitals filed for this visit. There is no height or weight on file to calculate BMI. Generalized: Well developed, Obese female in no acute distress  Head: normocephalic and atraumatic,. Oropharynx benign  Neck: Supple,  Musculoskeletal: No deformity   Neurological examination   Mentation: Alert oriented to time, place, history taking. Follows all commands speech and language fluent  Cranial nerve II-XII: Pupils were equal round reactive to light extraocular movements were full, visual field were full on confrontational test. Facial sensation and strength  were normal. hearing was intact to finger rubbing bilaterally. Uvula tongue midline. head turning and shoulder shrug were normal and symmetric.Tongue protrusion into cheek strength was normal. Motor: normal bulk and tone, full strength in the BUE, BLE, No focal weakness Coordination: finger-nose-finger, heel-to-shin bilaterally, no dysmetria Reflexes: 1+ upper lower and symmetric, plantar responses were flexor bilaterally. Gait and Station: Rising up from seated position without assistance, normal stance, moderate stride, good arm swing, smooth turning, able to perform tiptoe, and heel walking without difficulty. Tandem gait is steady DIAGNOSTIC DATA (LABS, IMAGING,  TESTING) -   ASSESSMENT AND PLAN 62 y.o. year old female has a past medical history of Seizure here to follow-up. She is currently on Depakote, Dilantin, and Keppra. Last  seizure was due to missing a dose of her medications in March 2017     Continue Depakote at current dose will refill when labs back  Continue Dilantin at current dose will refill will refill when labs back  Continue Keppra at current dose will refill Check labs today, CBC, CMP, to monitor adverse effects of seizure medications  Dilantin and VPA level to monitor for therapeutic level Follow-up yearly and when necessary Nilda RiggsNancy Carolyn Shanedra Lave, [facility]NP, [facility], APRN  [facility] [address], [address] [place], [place] [id number] [phone]

## 2017-11-12 LAB — PHENYTOIN LEVEL, FREE AND TOTAL
Phenytoin, Free: NOT DETECTED ug/mL (ref 1.0–2.0)
Phenytoin, Total: 2.7 ug/mL — ABNORMAL LOW (ref 10.0–20.0)

## 2017-11-13 LAB — LEVETIRACETAM LEVEL: Levetiracetam Lvl: 11.6 ug/mL (ref 10.0–40.0)

## 2017-11-15 ENCOUNTER — Ambulatory Visit: Payer: 59 | Admitting: Nurse Practitioner

## 2017-11-15 ENCOUNTER — Other Ambulatory Visit: Payer: Self-pay | Admitting: Nurse Practitioner

## 2017-11-15 ENCOUNTER — Telehealth: Payer: Self-pay | Admitting: *Deleted

## 2017-11-15 DIAGNOSIS — Z1211 Encounter for screening for malignant neoplasm of colon: Secondary | ICD-10-CM | POA: Diagnosis not present

## 2017-11-15 DIAGNOSIS — Z0000 Encounter for general adult medical examination without abnormal findings: Secondary | ICD-10-CM | POA: Diagnosis not present

## 2017-11-15 DIAGNOSIS — I700 Atherosclerosis of aorta: Secondary | ICD-10-CM | POA: Diagnosis not present

## 2017-11-15 DIAGNOSIS — N6012 Diffuse cystic mastopathy of left breast: Secondary | ICD-10-CM | POA: Diagnosis not present

## 2017-11-15 DIAGNOSIS — Z1231 Encounter for screening mammogram for malignant neoplasm of breast: Secondary | ICD-10-CM | POA: Diagnosis not present

## 2017-11-15 DIAGNOSIS — S2232XA Fracture of one rib, left side, initial encounter for closed fracture: Secondary | ICD-10-CM | POA: Diagnosis not present

## 2017-11-15 DIAGNOSIS — N6310 Unspecified lump in the right breast, unspecified quadrant: Secondary | ICD-10-CM

## 2017-11-15 DIAGNOSIS — G40909 Epilepsy, unspecified, not intractable, without status epilepticus: Secondary | ICD-10-CM | POA: Diagnosis not present

## 2017-11-15 DIAGNOSIS — Z808 Family history of malignant neoplasm of other organs or systems: Secondary | ICD-10-CM | POA: Diagnosis not present

## 2017-11-15 DIAGNOSIS — N6011 Diffuse cystic mastopathy of right breast: Secondary | ICD-10-CM | POA: Diagnosis not present

## 2017-11-15 MED FILL — HYDROCODON-APAP 5-325: 5-325 | 5 days supply | Qty: 40 | Fill #0

## 2017-11-15 NOTE — Telephone Encounter (Signed)
Patient was no show for follow up with NP today.  

## 2017-11-16 ENCOUNTER — Encounter: Payer: Self-pay | Admitting: Nurse Practitioner

## 2017-11-16 ENCOUNTER — Other Ambulatory Visit: Payer: Self-pay | Admitting: Nurse Practitioner

## 2017-11-17 ENCOUNTER — Telehealth: Payer: Self-pay | Admitting: Nurse Practitioner

## 2017-11-17 MED FILL — PHENYTOIN SOD EXT 100 MG CA: 100 | 90 days supply | Qty: 360 | Fill #0

## 2017-11-17 MED FILL — levETIRAcetam 500 MG TABS: 500 | 90 days supply | Qty: 180 | Fill #0

## 2017-11-17 MED FILL — DILANTIN 30 MG CAPSULE: 30 | 90 days supply | Qty: 90 | Fill #0

## 2017-11-17 MED FILL — DIVALPROEX SOD DR 500 MG TA: 500 | 90 days supply | Qty: 540 | Fill #0

## 2017-11-17 NOTE — Telephone Encounter (Signed)
Pt called said she is out of dilantin. RX was requested from pharmacy yesterday. Please call to advise when meds have been sent in

## 2017-11-17 NOTE — Telephone Encounter (Signed)
Attempted to reach patient to inform her refills have been sent in. Also wanted to remind her to keep her follow up Friday. No answer, voice mailbox is full. If she calls back, phone staff may tell her above messages.

## 2017-11-17 NOTE — Progress Notes (Signed)
[facility]  PATIENT: Diana Vega DOB: 12/01/1955   REASON FOR VISIT: Follow-up for generalized convulsive epilepsy HISTORY FROM: Patient    HISTORY OF PRESENT ILLNESS:Diana Vega, 62 year old female returns for yearly followup.   She was seen in the emergency room 11/11/2017 for seizure.  Phenytoin level was 2.7.  Valproic acid level was 45.  Keppra level 11.6.  She denies missing any doses of her medication.  CT of the abdomen with right 11th rib fracture.  CMP with elevated liver enzymes, CBC within normal limits she returns today for followup.  Reviewed recent labs from primary care 11/15/2017, cholesterol 268 LDL 177 she needs refills and repeat labs  HISTORY: [facility], when she moved to the area from [place], for epilepsy with brief "petit mal" and very rare convulsive seizures. She was initially on Dilantin, Depakote, and phenobarbital. She was tapered off phenobarbital in 2006, had some breakthrough "small" seizures, and at that time was placed on Keppra. 3 sz Dec, May, June- 2007 each assoc w/ missed doses, first 2 w/ illness- May sz had rib fx, better now- post-ictal confusion- most recent sz had stopped Dilantin 4d earlier due to "tiredness"- did feel more awake prior to sz, now back- has failed Neurontin, Lamictal in past-    REVIEW OF SYSTEMS: Full 14 system review of systems performed and notable only for those listed, all others are neg:  Constitutional: neg  Cardiovascular: neg Ear/Nose/Throat: neg  Skin: neg Eyes: neg Respiratory: neg Gastroitestinal: neg  Hematology/Lymphatic: neg  Endocrine: neg Musculoskeletal:neg Allergy/Immunology: Food allergies Neurological: History of seizures Psychiatric: neg Sleep : neg   ALLERGIES: Allergies  Allergen Reactions  . Dilaudid [Hydromorphone Hcl]     acute renal failure   . Codeine     REACTION: GI upsets  . Eggs Or Egg-Derived Products   .  Hydrocodone-Acetaminophen     Percocet- REACTION: GI upsets  . Toradol [Ketorolac Tromethamine]     ? Acute renal failure.  Diana Vega. Zofran [Ondansetron Hcl] Nausea And Vomiting    HOME MEDICATIONS: Outpatient Medications Prior to Visit  Medication Sig Dispense Refill  . DILANTIN 30 MG ER capsule TAKE 1 CAPSULE (30 MG TOTAL) BY MOUTH AT BEDTIME. 90 capsule 3  . divalproex (DEPAKOTE) 500 MG DR tablet TAKE 3 TABLETS (1,500 MG TOTAL) BY MOUTH 2 (TWO) TIMES DAILY. 540 tablet 3  . HYDROcodone-acetaminophen (NORCO/VICODIN) 5-325 MG tablet Take 1 tablet by mouth every 6 (six) hours as needed for severe pain. 15 tablet 0  . ibuprofen (ADVIL,MOTRIN) 200 MG tablet Take 600 mg by mouth every 6 (six) hours as needed for headache or mild pain.    Marland Kitchen. levETIRAcetam (KEPPRA) 500 MG tablet TAKE 1 TABLET BY MOUTH 2 TIMES DAILY. 180 tablet 0  . phenytoin (DILANTIN) 100 MG ER capsule TAKE 2 CAPSULES (200 MG TOTAL) BY MOUTH 2 (TWO) TIMES DAILY. 360 capsule 3  . ondansetron (ZOFRAN ODT) 4 MG disintegrating tablet Take 1 tablet (4 mg total) by mouth every 8 (eight) hours as needed for nausea or vomiting. 20 tablet 0   No facility-administered medications prior to visit.     PAST MEDICAL HISTORY: Past Medical History:  Diagnosis Date  . Meningitis   . Seizure [facility](HCC)    seizure 11/10/17    PAST SURGICAL HISTORY: Past Surgical History:  Procedure Laterality Date  . CHOLECYSTECTOMY      FAMILY HISTORY: Family History  Problem Relation Age of Onset  . Cancer Mother  colon  . Cancer Father        Breast  . Cancer Brother        Prostate  . Kidney failure Brother   . Hepatitis C Brother   . Cancer Brother        Prostate    SOCIAL HISTORY: Social History   Socioeconomic History  . Marital status: Married    Spouse name: Not on file  . Number of children: 4  . Years of education: College  . Highest education level: Not on file  Occupational History  . Occupation: Teacher, adult education: [employer]   Social Needs  . Financial resource strain: Not on file  . Food insecurity:    Worry: Not on file    Inability: Not on file  . Transportation needs:    Medical: Not on file    Non-medical: Not on file  Tobacco Use  . Smoking status: Current Every Day Smoker    Packs/day: 0.50    Types: Cigarettes  . Smokeless tobacco: Never Used  Substance and Sexual Activity  . Alcohol use: No    Alcohol/week: 0.0 oz  . Drug use: No  . Sexual activity: Not on file  Lifestyle  . Physical activity:    Days per week: Not on file    Minutes per session: Not on file  . Stress: Not on file  Relationships  . Social connections:    Talks on phone: Not on file    Gets together: Not on file    Attends religious service: Not on file    Active member of club or organization: Not on file    Attends meetings of clubs or organizations: Not on file    Relationship status: Not on file  . Intimate partner violence:    Fear of current or ex partner: Not on file    Emotionally abused: Not on file    Physically abused: Not on file    Forced sexual activity: Not on file  Other Topics Concern  . Not on file  Social History Narrative   Patient lives at home with her husband.    Patient has 4 children.    Patient drinks 3 cups of caffeine daily.    Patient works as a Charity fundraiser for [employer].      PHYSICAL EXAM  Vitals:   11/19/17 0758  BP: 136/78  Pulse: 72  Weight: 199 lb (90.3 kg)  Height: 5\' 2"  (1.575 m)   Body mass index is 36.4 kg/m. Generalized: Well developed, Obese female in no acute distress  Head: normocephalic and atraumatic,. Oropharynx benign  Neck: Supple,  Musculoskeletal: No deformity   Neurological examination   Mentation: Alert oriented to time, place, history taking. Follows all commands speech and language fluent  Cranial nerve II-XII: Pupils were equal round reactive to light extraocular movements were full, visual field were full on confrontational test. Facial sensation  and strength were normal. hearing was intact to finger rubbing bilaterally. Uvula tongue midline. head turning and shoulder shrug were normal and symmetric.Tongue protrusion into cheek strength was normal. Motor: normal bulk and tone, full strength in the BUE, BLE, No focal weakness Coordination: finger-nose-finger, heel-to-shin bilaterally, no dysmetria Reflexes: 1+ upper lower and symmetric, plantar responses were flexor bilaterally. Gait and Station: Rising up from seated position without assistance, normal stance, moderate stride, good arm swing, smooth turning, able to perform tiptoe, and heel walking without difficulty. Tandem gait is steady DIAGNOSTIC DATA (LABS, IMAGING, TESTING) -  ASSESSMENT AND PLAN 62 y.o. year old female has a past medical history of Seizure here to follow-up. She is currently on Depakote, Dilantin, and Keppra. Seen in the emergency room 11/11/2017 for seizure.  Phenytoin level was 2.7.  Valproic acid level was 45.  Keppra level 11.6.  PLAN: Continue Depakote at current dose will refill when labs back  Continue Dilantin at current dose will await for return lab is still subtherapeutic will discontinue and add Vimpat Continue Keppra at current dose will refill Dilantin and VPA level to monitor for therapeutic level Patient is adamant that she has not missed doses of her medication Follow-up 6 months Nilda Riggs, [facility], [facility], APRN  [facility] [address], [address] [place], [place] [address] [phone]

## 2017-11-19 ENCOUNTER — Encounter: Payer: Self-pay | Admitting: Nurse Practitioner

## 2017-11-19 ENCOUNTER — Ambulatory Visit: Payer: 59 | Admitting: Nurse Practitioner

## 2017-11-19 VITALS — BP 136/78 | HR 72 | Ht 62.0 in | Wt 199.0 lb

## 2017-11-19 DIAGNOSIS — G40309 Generalized idiopathic epilepsy and epileptic syndromes, not intractable, without status epilepticus: Secondary | ICD-10-CM | POA: Diagnosis not present

## 2017-11-19 DIAGNOSIS — Z5181 Encounter for therapeutic drug level monitoring: Secondary | ICD-10-CM

## 2017-11-19 NOTE — Patient Instructions (Signed)
Continue Depakote at current dose will refill when labs back  Continue Dilantin at current dose will refill will refill when labs back  Continue Keppra at current dose will refill Dilantin and VPA level to monitor for therapeutic level Follow-up 6 months

## 2017-11-20 LAB — VALPROIC ACID LEVEL: VALPROIC ACID LVL: 43 ug/mL — AB (ref 50–100)

## 2017-11-20 LAB — PHENYTOIN LEVEL, TOTAL: Phenytoin (Dilantin), Serum: 12.1 ug/mL (ref 10.0–20.0)

## 2017-11-22 ENCOUNTER — Telehealth: Payer: Self-pay | Admitting: Nurse Practitioner

## 2017-11-22 MED ORDER — DIVALPROEX SODIUM 500 MG PO DR TAB: 1500.0000 mg | DELAYED_RELEASE_TABLET | Freq: Two times a day (BID) | ORAL | 3 refills | Status: DC

## 2017-11-22 MED ORDER — LEVETIRACETAM 500 MG PO TABS: ORAL_TABLET | ORAL | 3 refills | Status: DC

## 2017-11-22 MED ORDER — PHENYTOIN SODIUM EXTENDED 100 MG PO CAPS: 200.0000 mg | ORAL_CAPSULE | Freq: Two times a day (BID) | ORAL | 3 refills | Status: DC

## 2017-11-22 MED ORDER — PHENYTOIN SODIUM EXTENDED 30 MG PO CAPS: ORAL_CAPSULE | ORAL | 3 refills | Status: DC

## 2017-11-22 NOTE — Telephone Encounter (Signed)
Valproic acid level 43 Dilantin 12.1 Keppra 11.6 in the ER Increase Keppra to 1 in the AM and 2 in the PM.  Continue Dilantin and Depakote at current doses.  Will refill please call the patient

## 2017-11-22 NOTE — Telephone Encounter (Signed)
LVM requesting call back re: lab results and medication instructions.

## 2017-11-24 ENCOUNTER — Other Ambulatory Visit: Payer: Self-pay | Admitting: Neurology

## 2017-11-24 NOTE — Telephone Encounter (Signed)
Pt returning RN's call.

## 2017-11-24 NOTE — Telephone Encounter (Signed)
Spoke with patient and informed her of NP's message. Advised the refill Rx on new dosing for Keppra was sent Monday afternoon. She asked for lab results; this RN advised her. She stated the Dilantin was still low. This RN advised her that Diana Vega wants her to continue the same dose of Dilantin and Depakote.  Advised she pick up new prescription and begin taking. She verbalized agreement, understanding, appreciation.

## 2017-11-25 ENCOUNTER — Other Ambulatory Visit: Payer: Self-pay | Admitting: *Deleted

## 2017-11-25 MED ORDER — LEVETIRACETAM 500 MG PO TABS: ORAL_TABLET | ORAL | 3 refills | Status: DC

## 2017-12-03 ENCOUNTER — Ambulatory Visit: Payer: 59

## 2017-12-03 ENCOUNTER — Ambulatory Visit
Admission: RE | Admit: 2017-12-03 | Discharge: 2017-12-03 | Disposition: A | Discharge status: Home / Self Care | Payer: 59 | Source: Ambulatory Visit | Attending: Nurse Practitioner | Admitting: Nurse Practitioner

## 2017-12-03 DIAGNOSIS — R922 Inconclusive mammogram: Secondary | ICD-10-CM | POA: Diagnosis not present

## 2017-12-03 DIAGNOSIS — N6310 Unspecified lump in the right breast, unspecified quadrant: Secondary | ICD-10-CM

## 2017-12-03 NOTE — Progress Notes (Signed)
I agree with the assessment and plan as directed by NP .The patient is known to me .   Aleksandr Pellow, MD  

## 2017-12-14 ENCOUNTER — Telehealth: Payer: Self-pay | Admitting: *Deleted

## 2017-12-14 NOTE — Telephone Encounter (Signed)
Received from [employer], form for medical clearance for pt who has hx sz and will be having colonoscopy TBS.

## 2017-12-15 NOTE — Telephone Encounter (Signed)
Fax confirmation for medical clearance note to [employer]  [phone].

## 2017-12-15 NOTE — Telephone Encounter (Signed)
Per Dr. Vickey Hugerohmeier her seizure meds should be taken prior to the procedure.  If she needs to be n.p.o. greater than 12 hours Keppra can be given IV.  Last seizure event was 11/11/2017

## 2018-01-05 NOTE — Progress Notes (Signed)
I agree with the assessment and plan as directed by NP .The patient is known to me .   Janeliz Prestwood, MD  

## 2018-01-14 DIAGNOSIS — Z1211 Encounter for screening for malignant neoplasm of colon: Secondary | ICD-10-CM | POA: Diagnosis not present

## 2018-01-14 DIAGNOSIS — Z91012 Allergy to eggs: Secondary | ICD-10-CM | POA: Diagnosis not present

## 2018-01-14 DIAGNOSIS — Z800 Family history of malignant neoplasm of digestive organs: Secondary | ICD-10-CM | POA: Diagnosis not present

## 2018-01-14 DIAGNOSIS — R112 Nausea with vomiting, unspecified: Secondary | ICD-10-CM | POA: Diagnosis not present

## 2018-02-18 ENCOUNTER — Other Ambulatory Visit: Payer: Self-pay | Admitting: Neurology

## 2018-02-18 MED FILL — levETIRAcetam 500 MG TABS: 500 | 90 days supply | Qty: 270 | Fill #0

## 2018-02-18 MED FILL — DIVALPROEX SOD DR 500 MG TA: 500 | 90 days supply | Qty: 540 | Fill #1

## 2018-02-18 MED FILL — PHENYTOIN SODIUM EXTENDED 1: 100 | 90 days supply | Qty: 360 | Fill #1

## 2018-02-18 MED FILL — DILANTIN 30 MG CAPSULE: 30 | 90 days supply | Qty: 90 | Fill #1

## 2018-03-17 MED FILL — PEG-3350 SOLUTION: 420 | 1 days supply | Qty: 4000 | Fill #0

## 2018-03-21 DIAGNOSIS — R918 Other nonspecific abnormal finding of lung field: Secondary | ICD-10-CM | POA: Diagnosis not present

## 2018-03-21 DIAGNOSIS — G40909 Epilepsy, unspecified, not intractable, without status epilepticus: Secondary | ICD-10-CM | POA: Diagnosis not present

## 2018-03-21 DIAGNOSIS — E7800 Pure hypercholesterolemia, unspecified: Secondary | ICD-10-CM | POA: Diagnosis not present

## 2018-03-21 DIAGNOSIS — Z720 Tobacco use: Secondary | ICD-10-CM | POA: Diagnosis not present

## 2018-03-21 DIAGNOSIS — Z0000 Encounter for general adult medical examination without abnormal findings: Secondary | ICD-10-CM | POA: Diagnosis not present

## 2018-03-21 DIAGNOSIS — E669 Obesity, unspecified: Secondary | ICD-10-CM | POA: Diagnosis not present

## 2018-03-21 DIAGNOSIS — I700 Atherosclerosis of aorta: Secondary | ICD-10-CM | POA: Diagnosis not present

## 2018-03-21 DIAGNOSIS — Z6838 Body mass index (BMI) 38.0-38.9, adult: Secondary | ICD-10-CM | POA: Diagnosis not present

## 2018-03-21 DIAGNOSIS — R945 Abnormal results of liver function studies: Secondary | ICD-10-CM | POA: Diagnosis not present

## 2018-05-26 MED FILL — levETIRAcetam 500 MG TABS: 500 | 90 days supply | Qty: 270 | Fill #1

## 2018-05-26 MED FILL — DIVALPROEX SOD DR 500 MG TA: 500 | 90 days supply | Qty: 540 | Fill #2

## 2018-05-26 MED FILL — PHENYTOIN SODIUM EXTENDED 1: 100 | 90 days supply | Qty: 360 | Fill #2

## 2018-05-26 MED FILL — DILANTIN 30 MG CAPSULE: 30 | 90 days supply | Qty: 90 | Fill #2

## 2018-07-06 ENCOUNTER — Other Ambulatory Visit: Payer: Self-pay | Admitting: Internal Medicine

## 2018-07-11 ENCOUNTER — Other Ambulatory Visit: Payer: Self-pay | Admitting: Internal Medicine

## 2018-07-11 DIAGNOSIS — R918 Other nonspecific abnormal finding of lung field: Secondary | ICD-10-CM

## 2018-08-19 MED FILL — PHENYTOIN SODIUM EXTENDED 1: 100 | 90 days supply | Qty: 360 | Fill #0

## 2018-08-19 MED FILL — DIVALPROEX SOD DR 500 MG TA: 500 | 90 days supply | Qty: 540 | Fill #0

## 2018-08-19 MED FILL — levETIRAcetam 500 MG TABS: 500 | 90 days supply | Qty: 270 | Fill #0

## 2018-08-19 MED FILL — DILANTIN 30 MG CAPSULE: 30 | 90 days supply | Qty: 90 | Fill #0

## 2018-12-29 ENCOUNTER — Other Ambulatory Visit: Payer: Self-pay | Admitting: Adult Health

## 2018-12-29 MED ORDER — PHENYTOIN SODIUM EXTENDED 100 MG PO CAPS: 200.0000 mg | ORAL_CAPSULE | Freq: Two times a day (BID) | ORAL | 3 refills | Status: DC

## 2018-12-29 MED ORDER — PHENYTOIN SODIUM EXTENDED 30 MG PO CAPS: ORAL_CAPSULE | ORAL | 3 refills | Status: DC

## 2018-12-29 MED FILL — PHENYTOIN SODIUM EXTENDED 1: 100 | 90 days supply | Qty: 360 | Fill #0

## 2018-12-29 MED FILL — DILANTIN 30 MG CAPSULE: 30 | 90 days supply | Qty: 90 | Fill #0

## 2018-12-29 NOTE — Telephone Encounter (Signed)
Patient called and requested a refill on Dilantin and a follow up apt with an NP. I scheduled her apt please fill medication.

## 2019-01-03 ENCOUNTER — Ambulatory Visit: Payer: 59 | Admitting: Adult Health

## 2019-01-11 ENCOUNTER — Encounter: Payer: Self-pay | Admitting: Adult Health

## 2019-01-11 ENCOUNTER — Other Ambulatory Visit: Payer: Self-pay

## 2019-01-11 ENCOUNTER — Ambulatory Visit (INDEPENDENT_AMBULATORY_CARE_PROVIDER_SITE_OTHER): Payer: 59 | Admitting: Adult Health

## 2019-01-11 VITALS — BP 122/80 | HR 70 | Temp 98.1°F | Ht 61.5 in | Wt 210.8 lb

## 2019-01-11 DIAGNOSIS — G40309 Generalized idiopathic epilepsy and epileptic syndromes, not intractable, without status epilepticus: Secondary | ICD-10-CM

## 2019-01-11 DIAGNOSIS — Z5181 Encounter for therapeutic drug level monitoring: Secondary | ICD-10-CM | POA: Diagnosis not present

## 2019-01-11 NOTE — Patient Instructions (Signed)
Your Plan:  Continue Depakote, Keppra and Dilantin Blood work today If your symptoms worsen or you develop new symptoms please let us know.   Thank you for coming to see [place] at [facility]. I hope we have been able to provide you high quality care today.  You may receive a patient satisfaction survey over the next few weeks. We would appreciate your feedback and comments so that we may continue to improve ourselves and the health of our patients.

## 2019-01-11 NOTE — Progress Notes (Signed)
PATIENT: Diana Vega DOB: 04/15/1956  REASON FOR VISIT: follow up HISTORY FROM: patient  HISTORY OF PRESENT ILLNESS: Today 01/11/19: Diana Vega is a 63 year old female with a history of seizures.  She returns today for follow-up.  She reports that she has been doing well.  She continues on Dilantin, Depakote and Keppra.  She denies any seizure events.  She denies any changes in her gait or balance.  No change in her mood or behavior.  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  She returns today for an evaluation.  HISTORY 11/19/17: Diana Vega, 63 year old female returns for yearly followup.   She was seen in the emergency room 11/11/2017 for seizure.  Phenytoin level was 2.7.  Valproic acid level was 45.  Keppra level 11.6.  She denies missing any doses of her medication.  CT of the abdomen with right 11th rib fracture.  CMP with elevated liver enzymes, CBC within normal limits she returns today for followup.  Reviewed recent labs from primary care 11/15/2017, cholesterol 268 LDL 177 she needs refills and repeat labs  REVIEW OF SYSTEMS: Out of a complete 14 system review of symptoms, the patient complains only of the following symptoms, and all other reviewed systems are negative.   See HPI  ALLERGIES: Allergies  Allergen Reactions  . Dilaudid [Hydromorphone Hcl]     acute renal failure   . Codeine     REACTION: GI upsets  . Eggs Or Egg-Derived Products   . Hydrocodone-Acetaminophen     Percocet- REACTION: GI upsets  . Toradol [Ketorolac Tromethamine]     ? Acute renal failure.  Deirdre Peer. Zofran [Ondansetron Hcl] Nausea And Vomiting    HOME MEDICATIONS: Outpatient Medications Prior to Visit  Medication Sig Dispense Refill  . divalproex (DEPAKOTE) 500 MG DR tablet Take 3 tablets (1,500 mg total) by mouth 2 (two) times daily. 540 tablet 3  . ibuprofen (ADVIL,MOTRIN) 200 MG tablet Take 600 mg by mouth every 6 (six) hours as needed for headache or mild pain.    Marland Kitchen. levETIRAcetam  (KEPPRA) 500 MG tablet take 1 tab in the am 2 tabs at night 270 tablet 3  . phenytoin (DILANTIN) 100 MG ER capsule Take 2 capsules (200 mg total) by mouth 2 (two) times daily. 360 capsule 3  . phenytoin (DILANTIN) 30 MG ER capsule TAKE 1 CAPSULE (30 MG TOTAL) BY MOUTH AT BEDTIME. 90 capsule 3  . HYDROcodone-acetaminophen (NORCO/VICODIN) 5-325 MG tablet Take 1 tablet by mouth every 6 (six) hours as needed for severe pain. 15 tablet 0   No facility-administered medications prior to visit.     PAST MEDICAL HISTORY: Past Medical History:  Diagnosis Date  . Meningitis   . Seizure [facility](HCC)    seizure 11/10/17    PAST SURGICAL HISTORY: Past Surgical History:  Procedure Laterality Date  . CHOLECYSTECTOMY      FAMILY HISTORY: Family History  Problem Relation Age of Onset  . Cancer Mother        colon  . Cancer Father        Breast  . Cancer Brother        Prostate  . Kidney failure Brother   . Hepatitis C Brother   . Cancer Brother        Prostate    SOCIAL HISTORY: Social History   Socioeconomic History  . Marital status: Married    Spouse name: Not on file  . Number of children: 4  . Years  of education: College  . Highest education level: Not on file  Occupational History  . Occupation: Teacher, adult education: [employer]  Social Needs  . Financial resource strain: Not on file  . Food insecurity    Worry: Not on file    Inability: Not on file  . Transportation needs    Medical: Not on file    Non-medical: Not on file  Tobacco Use  . Smoking status: Current Every Day Smoker    Packs/day: 0.50    Types: Cigarettes  . Smokeless tobacco: Never Used  Substance and Sexual Activity  . Alcohol use: No    Alcohol/week: 0.0 standard drinks  . Drug use: No  . Sexual activity: Not on file  Lifestyle  . Physical activity    Days per week: Not on file    Minutes per session: Not on file  . Stress: Not on file  Relationships  . Social Musician on phone: Not on file     Gets together: Not on file    Attends religious service: Not on file    Active member of club or organization: Not on file    Attends meetings of clubs or organizations: Not on file    Relationship status: Not on file  . Intimate partner violence    Fear of current or ex partner: Not on file    Emotionally abused: Not on file    Physically abused: Not on file    Forced sexual activity: Not on file  Other Topics Concern  . Not on file  Social History Narrative   Patient lives at home with her husband.    Patient has 4 children.    Patient drinks 3 cups of caffeine daily.    Patient works as a Charity fundraiser for [employer].       PHYSICAL EXAM  Vitals:   01/11/19 1437  BP: 122/80  Pulse: 70  Temp: 98.1 F (36.7 C)  Weight: 210 lb 12.8 oz (95.6 kg)  Height: 5' 1.5" (1.562 m)   Body mass index is 39.19 kg/m.  Generalized: Well developed, in no acute distress   Neurological examination  Mentation: Alert oriented to time, place, history taking. Follows all commands speech and language fluent Cranial nerve II-XII: Pupils were equal round reactive to light. Extraocular movements were full, visual field were full on confrontational test.. Head turning and shoulder shrug  were normal and symmetric. Motor: The motor testing reveals 5 over 5 strength of all 4 extremities. Good symmetric motor tone is noted throughout.  Sensory: Sensory testing is intact to soft touch on all 4 extremities. No evidence of extinction is noted.  Coordination: Cerebellar testing reveals good finger-nose-finger and heel-to-shin bilaterally.  Gait and station: Gait is normal. Tandem gait is normal. Romberg is negative. No drift is seen.  Reflexes: Deep tendon reflexes are symmetric and normal bilaterally.   DIAGNOSTIC DATA (LABS, IMAGING, TESTING) - I reviewed patient records, labs, notes, testing and imaging myself where available.  Lab Results  Component Value Date   WBC 10.7 (H) 11/11/2017   HGB 13.4  11/11/2017   HCT 40.4 11/11/2017   MCV 92.4 11/11/2017   PLT 241 11/11/2017      Component Value Date/Time   NA 139 11/11/2017 0512   NA 143 11/12/2016 1152   K 4.4 11/11/2017 0512   CL 105 11/11/2017 0512   CO2 24 11/11/2017 0512   GLUCOSE 110 (H) 11/11/2017 0512   BUN  18 11/11/2017 0512   BUN 13 11/12/2016 1152   CREATININE 0.55 11/11/2017 0512   CALCIUM 8.7 (L) 11/11/2017 0512   PROT 6.0 (L) 11/11/2017 0512   PROT 6.4 11/12/2016 1152   ALBUMIN 3.4 (L) 11/11/2017 0512   ALBUMIN 3.9 11/12/2016 1152   AST 108 (H) 11/11/2017 0512   ALT 84 (H) 11/11/2017 0512   ALKPHOS 84 11/11/2017 0512   BILITOT 0.5 11/11/2017 0512   BILITOT <0.2 11/12/2016 1152   GFRNONAA >60 11/11/2017 0512   GFRAA >60 11/11/2017 0512   Lab Results  Component Value Date   CHOL 238 (H) 02/07/2010   HDL 82.00 02/07/2010   LDLDIRECT 120.2 02/07/2010   TRIG 112.0 02/07/2010   CHOLHDL 3 02/07/2010       ASSESSMENT AND PLAN 63 y.o. year old female  has a past medical history of Meningitis and Seizure ([place]). here with:  1.  Seizures  Overall the patient is doing well.  She will continue on Keppra, Depakote and Dilantin.  I will check blood work today.  She is advised that she has any seizure event she should let us know.  She will follow-up in 1 year or sooner if needed.   Ward Givens, MSN, NP-C 01/11/2019, 3:11 PM [facility] [address] [place], [place] [address] [phone]

## 2019-01-12 ENCOUNTER — Telehealth: Payer: Self-pay | Admitting: *Deleted

## 2019-01-12 LAB — CBC WITH DIFFERENTIAL/PLATELET
Basophils Absolute: 0 10*3/uL (ref 0.0–0.2)
Basos: 0 %
EOS (ABSOLUTE): 0.2 10*3/uL (ref 0.0–0.4)
Eos: 3 %
Hematocrit: 41.2 % (ref 34.0–46.6)
Hemoglobin: 13.6 g/dL (ref 11.1–15.9)
Immature Grans (Abs): 0 10*3/uL (ref 0.0–0.1)
Immature Granulocytes: 0 %
Lymphocytes Absolute: 3.4 10*3/uL — ABNORMAL HIGH (ref 0.7–3.1)
Lymphs: 42 %
MCH: 30.2 pg (ref 26.6–33.0)
MCHC: 33 g/dL (ref 31.5–35.7)
MCV: 91 fL (ref 79–97)
Monocytes Absolute: 0.7 10*3/uL (ref 0.1–0.9)
Monocytes: 9 %
Neutrophils Absolute: 3.7 10*3/uL (ref 1.4–7.0)
Neutrophils: 46 %
Platelets: 255 10*3/uL (ref 150–450)
RBC: 4.51 x10E6/uL (ref 3.77–5.28)
RDW: 13 % (ref 11.7–15.4)
WBC: 8.1 10*3/uL (ref 3.4–10.8)

## 2019-01-12 LAB — COMPREHENSIVE METABOLIC PANEL
ALT: 7 IU/L (ref 0–32)
AST: 12 IU/L (ref 0–40)
Albumin/Globulin Ratio: 1.6 (ref 1.2–2.2)
Albumin: 3.7 g/dL — ABNORMAL LOW (ref 3.8–4.8)
Alkaline Phosphatase: 80 IU/L (ref 39–117)
BUN/Creatinine Ratio: 18 (ref 12–28)
BUN: 11 mg/dL (ref 8–27)
Bilirubin Total: <0.2 mg/dL (ref 0.0–1.2)
CO2: 25 mmol/L (ref 20–29)
Calcium: 9 mg/dL (ref 8.7–10.3)
Chloride: 100 mmol/L (ref 96–106)
Creatinine, Ser: 0.6 mg/dL (ref 0.57–1.00)
GFR calc Af Amer: 113 mL/min/{1.73_m2} (ref >59)
GFR calc non Af Amer: 98 mL/min/{1.73_m2} (ref >59)
Globulin, Total: 2.3 g/dL (ref 1.5–4.5)
Glucose: 78 mg/dL (ref 65–99)
Potassium: 4.5 mmol/L (ref 3.5–5.2)
Sodium: 140 mmol/L (ref 134–144)
Total Protein: 6 g/dL (ref 6.0–8.5)

## 2019-01-12 LAB — VALPROIC ACID LEVEL: Valproic Acid Lvl: 57 ug/mL (ref 50–100)

## 2019-01-12 LAB — PHENYTOIN LEVEL, TOTAL: Phenytoin (Dilantin), Serum: 10 ug/mL (ref 10.0–20.0)

## 2019-01-12 NOTE — Telephone Encounter (Signed)
LVM informing patient her labs results are unremarkable. Left number for any questions.

## 2019-02-13 ENCOUNTER — Other Ambulatory Visit: Payer: Self-pay | Admitting: *Deleted

## 2019-02-13 MED ORDER — LEVETIRACETAM 500 MG PO TABS: ORAL_TABLET | ORAL | 3 refills | Status: DC

## 2019-02-13 MED FILL — levETIRAcetam 500 MG TABS: 500 | 90 days supply | Qty: 270 | Fill #0

## 2019-02-15 ENCOUNTER — Other Ambulatory Visit: Payer: Self-pay

## 2019-02-15 MED ORDER — DIVALPROEX SODIUM 500 MG PO DR TAB: 1500.0000 mg | DELAYED_RELEASE_TABLET | Freq: Two times a day (BID) | ORAL | 3 refills | Status: DC

## 2019-02-15 MED FILL — DIVALPROEX SOD DR 500 MG TA: 500 | 90 days supply | Qty: 540 | Fill #0

## 2019-04-25 MED FILL — PHENYTOIN SODIUM EXTENDED 1: 100 | 90 days supply | Qty: 360 | Fill #1

## 2019-04-25 MED FILL — DILANTIN 30 MG CAPSULE: 30 | 90 days supply | Qty: 90 | Fill #1

## 2019-06-05 MED FILL — DIVALPROEX SOD DR 500 MG TA: 500 | 90 days supply | Qty: 540 | Fill #1

## 2019-06-05 MED FILL — levETIRAcetam 500 MG TABS: 500 | 90 days supply | Qty: 270 | Fill #1

## 2019-08-18 MED FILL — PHENYTOIN SODIUM EXTENDED 1: 100 | 90 days supply | Qty: 360 | Fill #2

## 2019-08-18 MED FILL — DILANTIN 30 MG CAPSULE: 30 | 90 days supply | Qty: 90 | Fill #2

## 2019-09-22 MED FILL — levETIRAcetam 500 MG TABS: 500 | 90 days supply | Qty: 270 | Fill #2

## 2019-09-22 MED FILL — DIVALPROEX SOD DR 500 MG TA: 500 | 90 days supply | Qty: 540 | Fill #2

## 2019-12-15 MED FILL — levETIRAcetam 500 MG TABS: 500 | 90 days supply | Qty: 270 | Fill #3

## 2019-12-15 MED FILL — DILANTIN 30 MG CAPSULE: 30 | 90 days supply | Qty: 90 | Fill #3

## 2019-12-15 MED FILL — DIVALPROEX SOD DR 500 MG TA: 500 | 90 days supply | Qty: 540 | Fill #3

## 2019-12-15 MED FILL — PHENYTOIN SODIUM EXTENDED 1: 100 | 90 days supply | Qty: 360 | Fill #3

## 2019-12-29 IMAGING — CR DG RIBS W/ CHEST 3+V*R*
3 series · 3 of 3 positions shown · non-contrast
Comparison: None.

CLINICAL DATA: Fall after seizure.  Struck RIGHT ribs on chair.

EXAM:
RIGHT RIBS AND CHEST - 3+ VIEW

[chest pa]
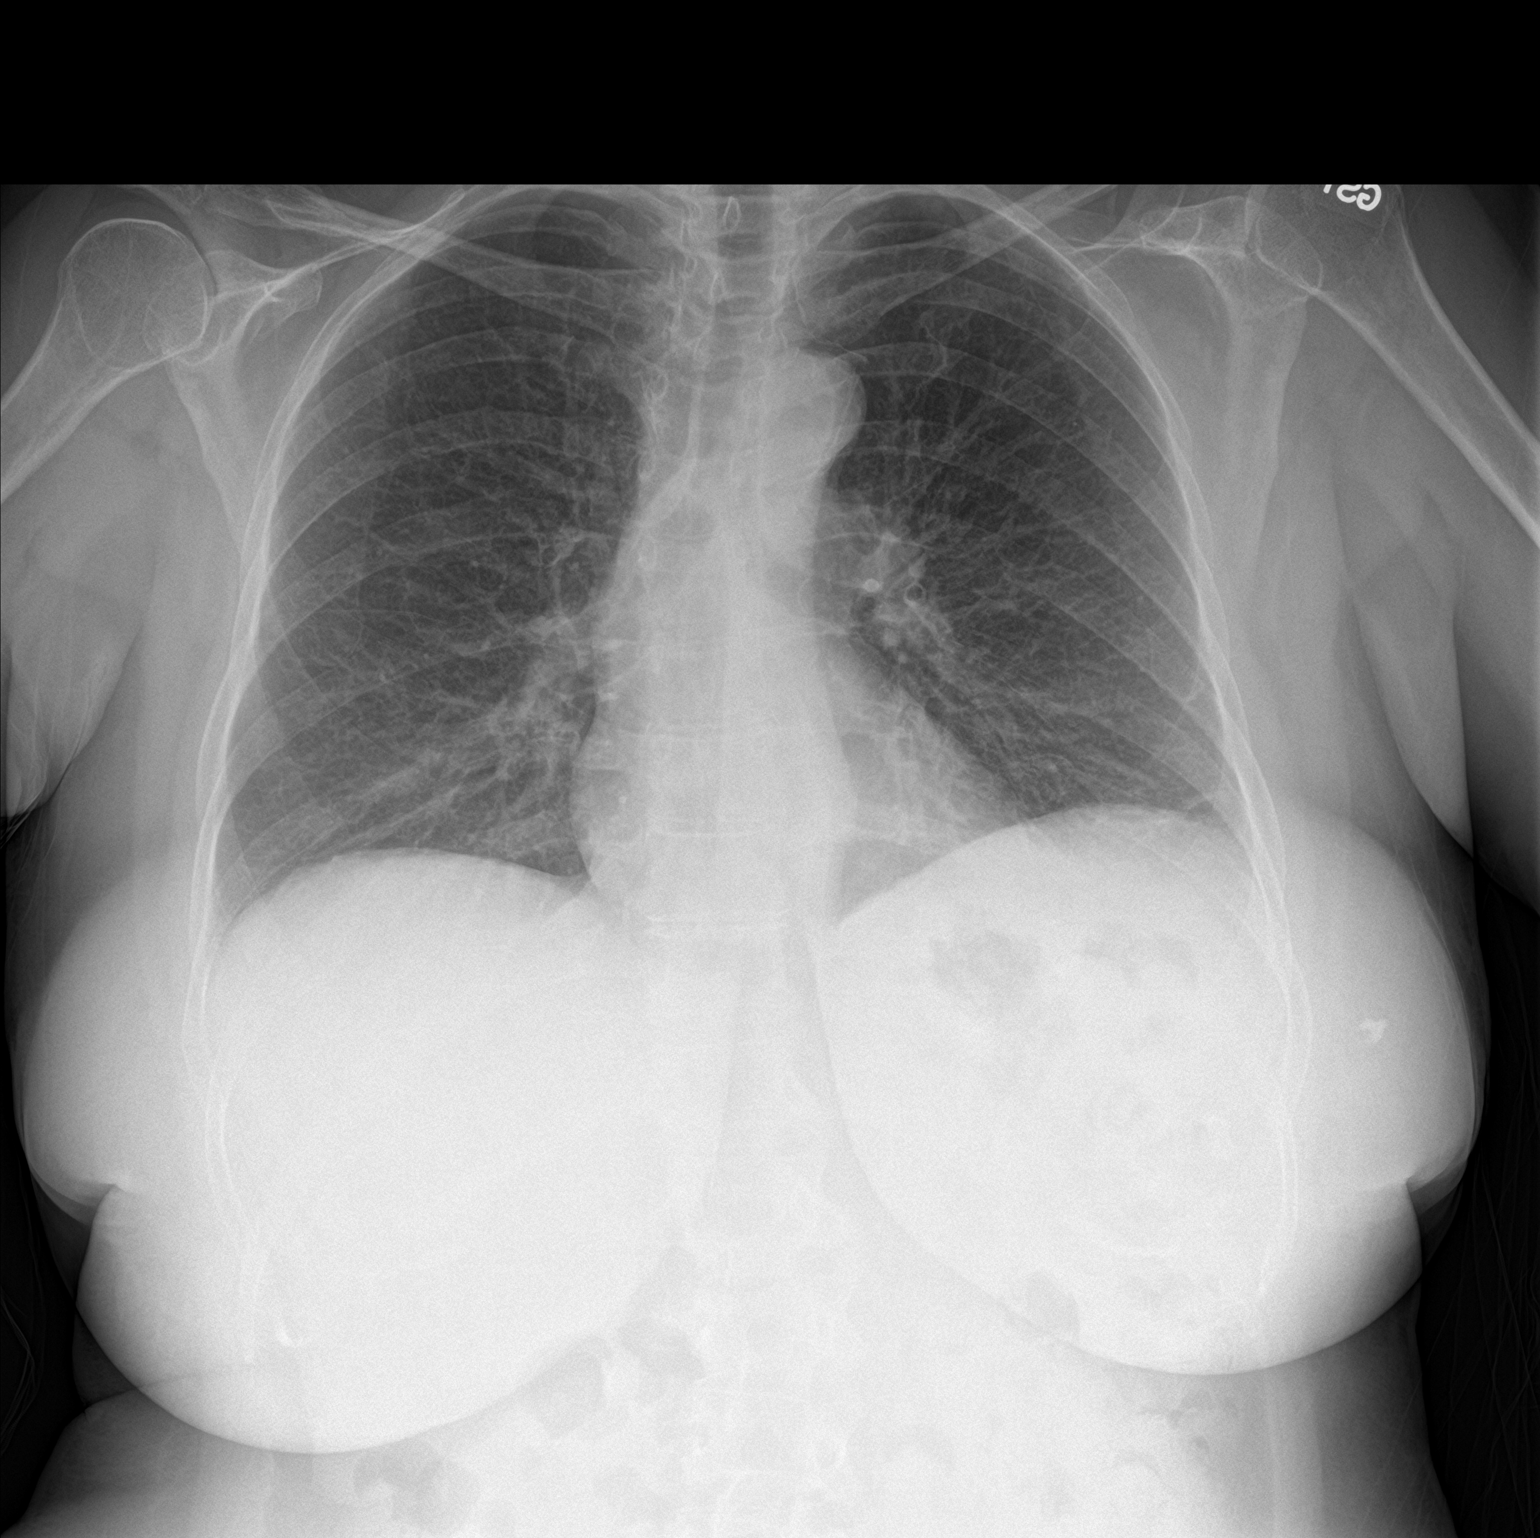

[rib pa]
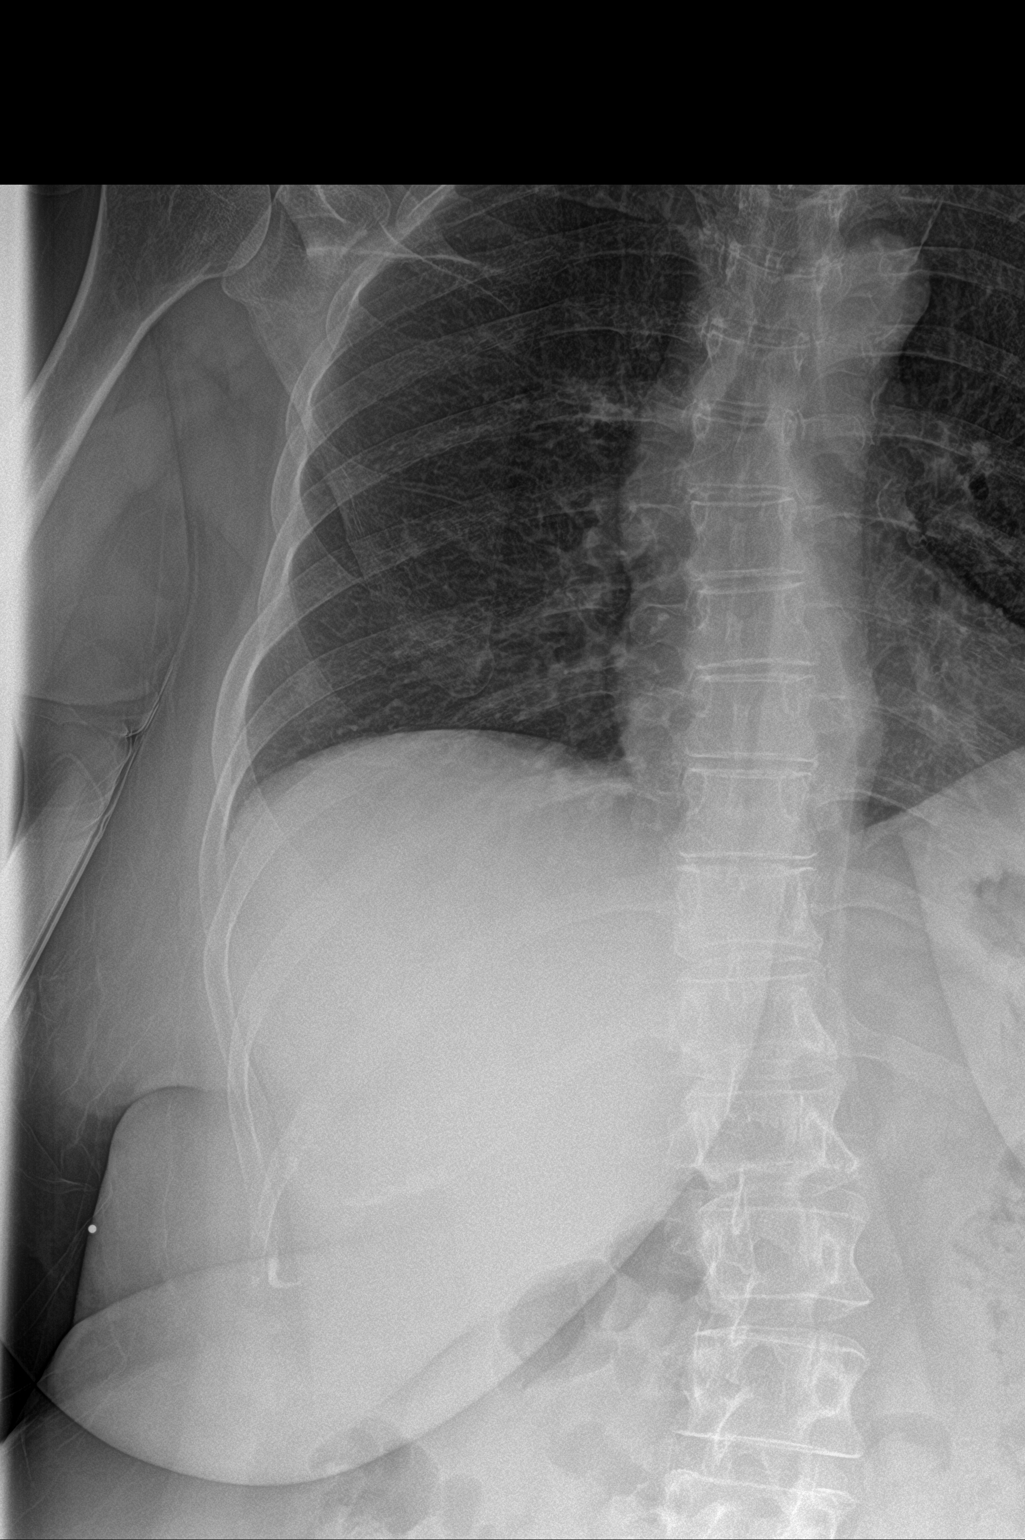

[rib pa obl]
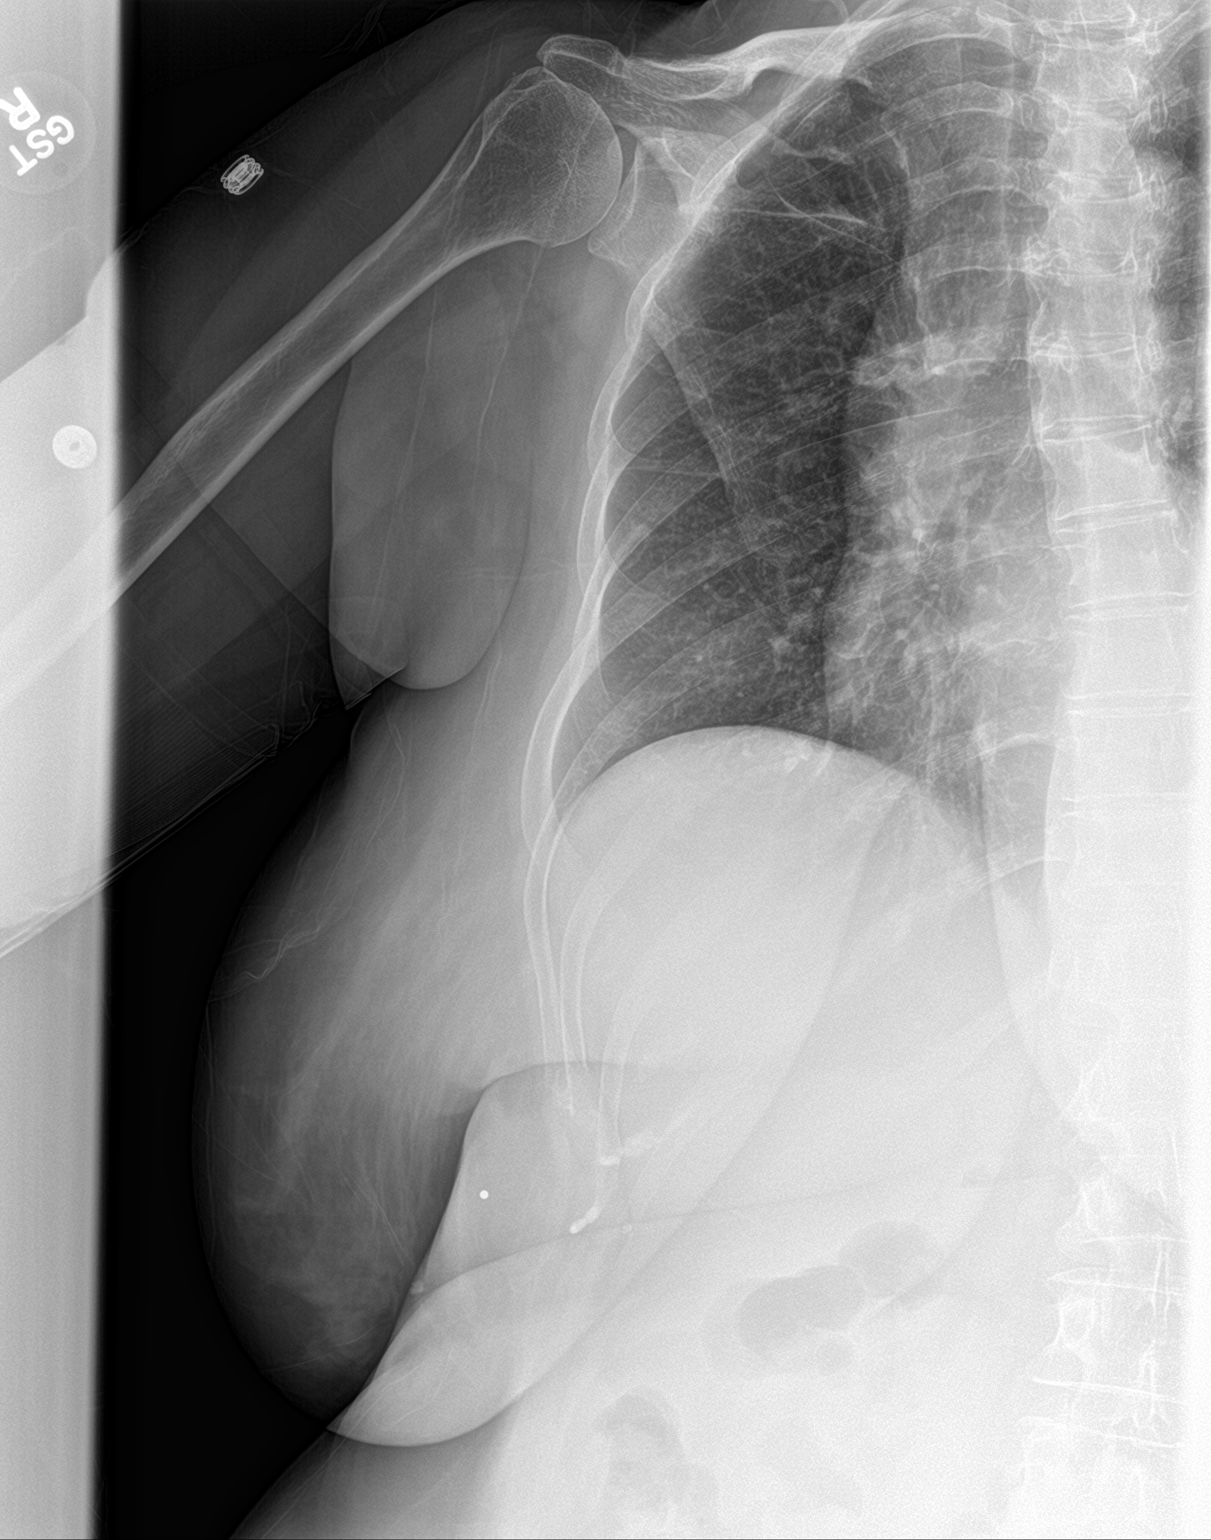

[3 of 3 positions shown; findings below may reference images not displayed]

FINDINGS: No fracture or other bone lesions are seen involving the ribs. There
is no evidence of pneumothorax or pleural effusion. Strandy
densities LEFT lung base. Both lungs are otherwise clear. Heart size
and mediastinal contours are within normal limits. Mildly calcified
aortic arch. Coarse calcification LEFT breast.
IMPRESSION: LEFT lung base atelectasis/scarring.

No rib fracture deformity.

Aortic Atherosclerosis ([id number]).

## 2019-12-29 IMAGING — CT CT CHEST W/ CM
2 of 5 series · 13 of 46 positions shown, 15 images · IV contrast (omnipaque)
Comparison: Radiographs from 11/11/2017

CLINICAL DATA: Right flank pain. Fall from a chair due to a seizure
8 hours prior to imaging.

EXAM:
CT CHEST, ABDOMEN, AND PELVIS WITH CONTRAST
TECHNIQUE: Multidetector CT imaging of the chest, abdomen and pelvis was
performed following the standard protocol during bolus
administration of intravenous contrast.
CONTRAST:  100mL OMNIPAQUE IOHEXOL 300 MG/ML  SOLN

[Series 3: cap with · axial · 0.69mm/px · z∈[+682,+1212]mm · 10 of 128 slices shown, 12 images]
[im 11/128  soft-tissue]
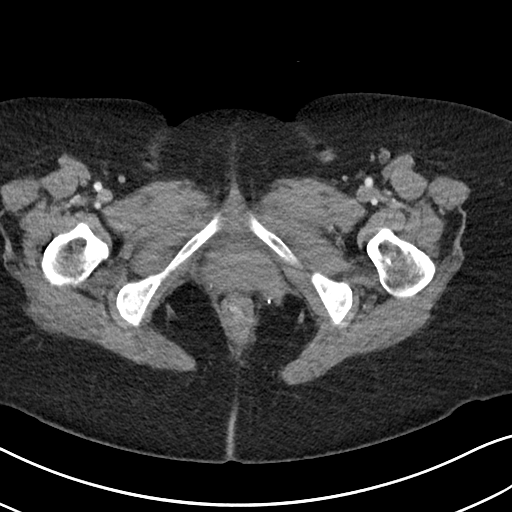
[im 11/128  bone]
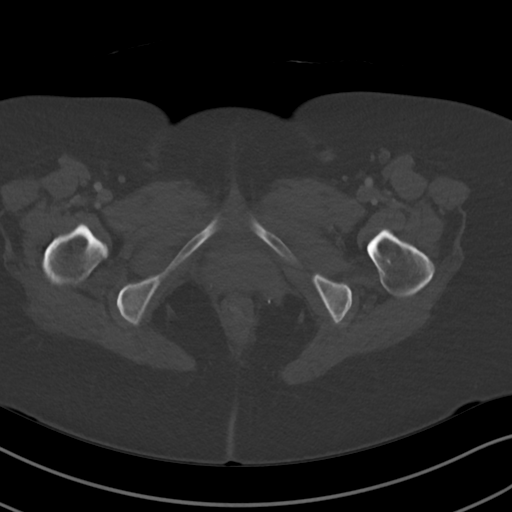
[im 22/128  soft-tissue]
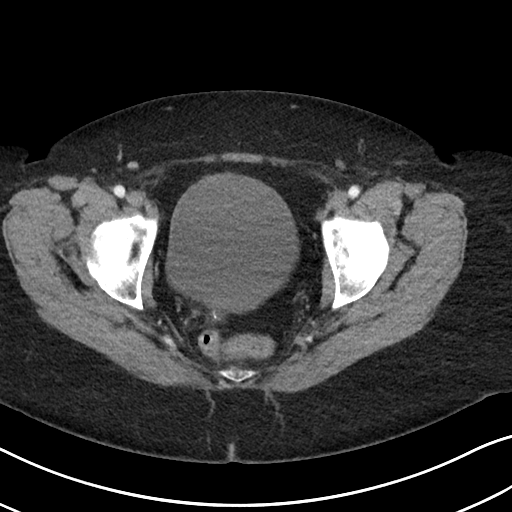
[im 32/128  soft-tissue]
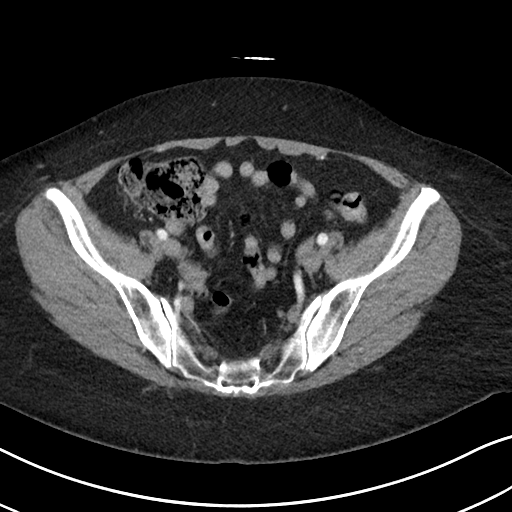
[im 43/128  soft-tissue]
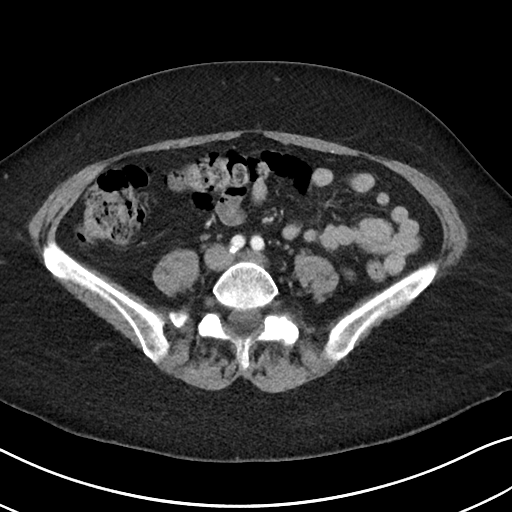
[im 53/128  soft-tissue]
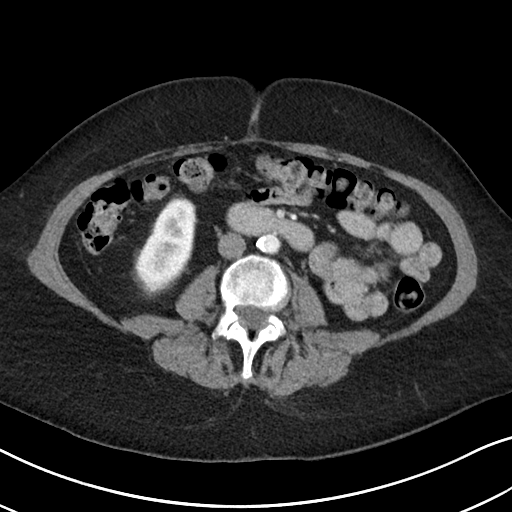
[im 75/128  soft-tissue]
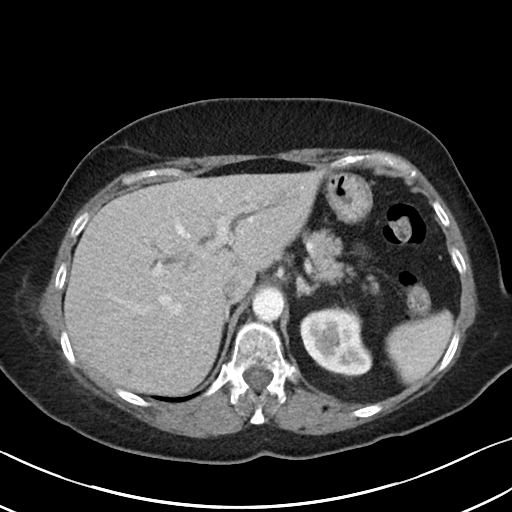
[im 85/128  soft-tissue]
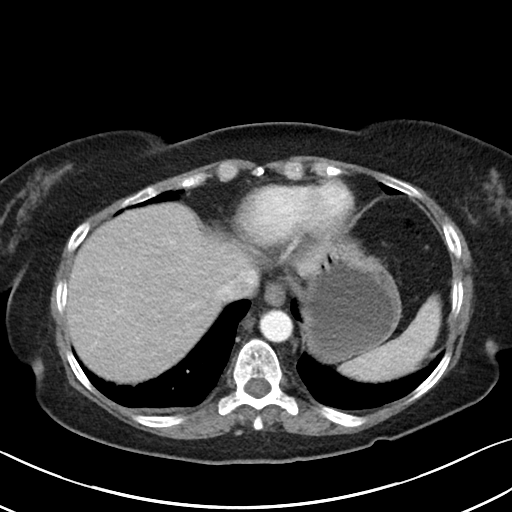
[im 96/128  soft-tissue]
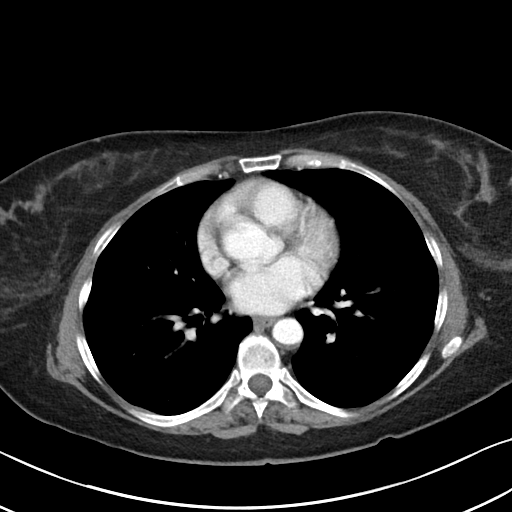
[im 106/128  soft-tissue]
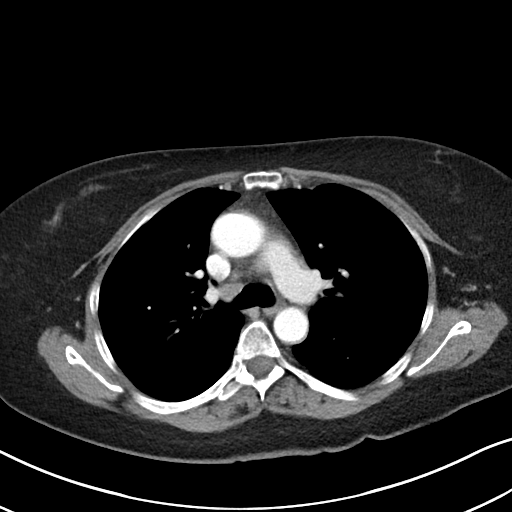
[im 106/128  bone]
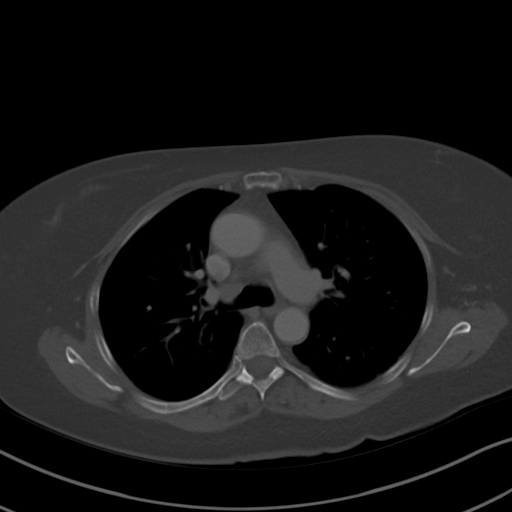
[im 117/128  soft-tissue]
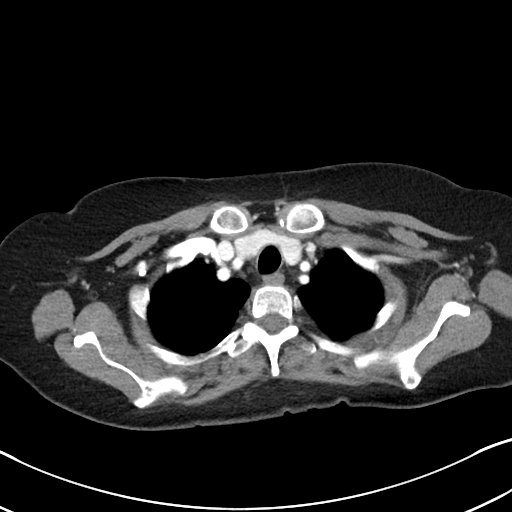

[Series 6: cor · coronal · 0.69mm/px · 3 of 101 slices shown]
[im 34/101  soft-tissue]
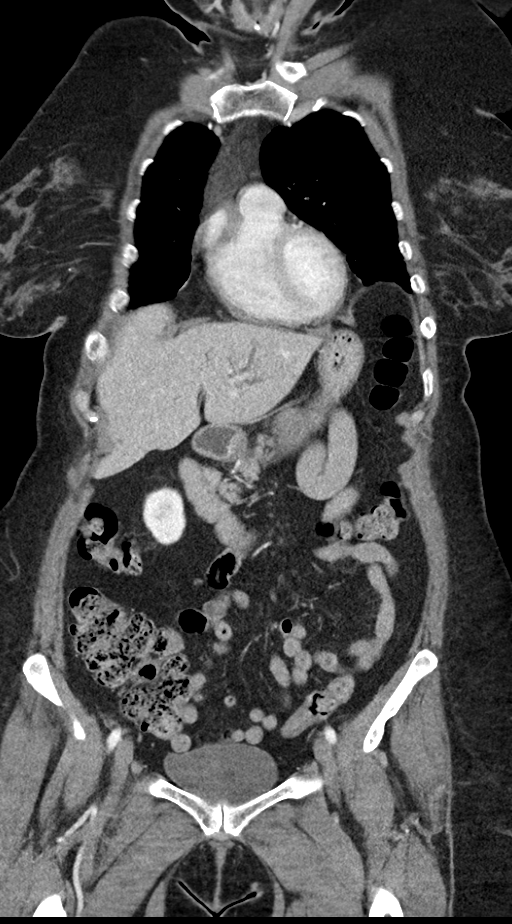
[im 45/101  soft-tissue]
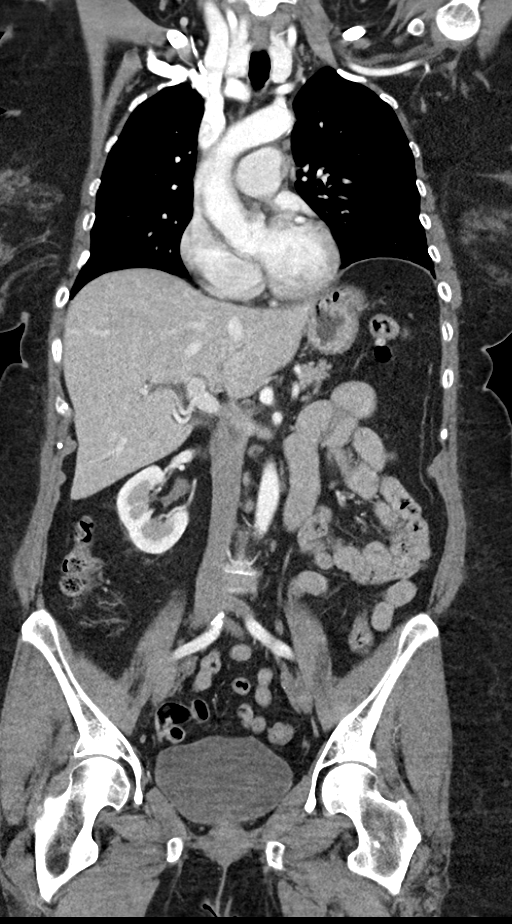
[im 56/101  soft-tissue]
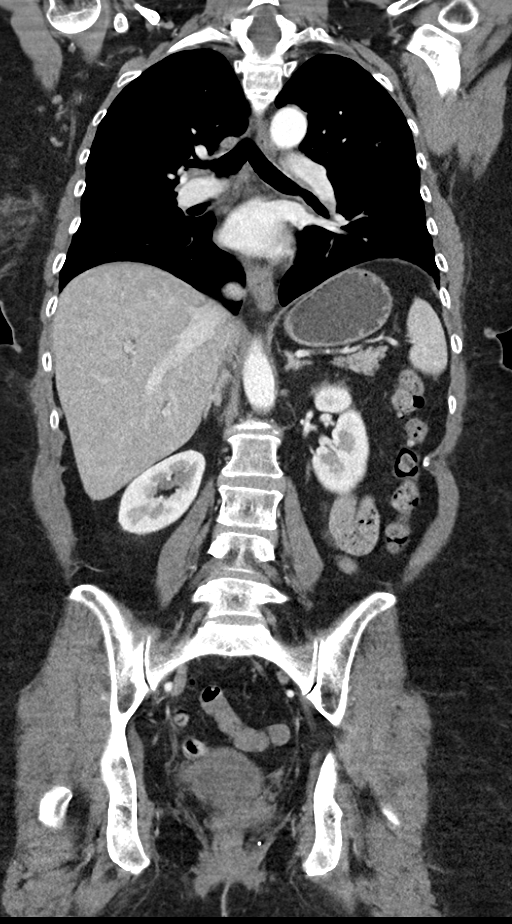

[13 of 46 positions shown; findings below may reference images not displayed]

FINDINGS: CT CHEST FINDINGS

Cardiovascular: Atherosclerotic calcification of the aortic arch.

Mediastinum/Nodes: Tiny calcification inferiorly in the right
thyroid lobe, likely benign. Small mediastinal lymph nodes are not
pathologically enlarged.

Lungs/Pleura: Anterior subpleural nodule in the right middle lobe
measures 0.6 cm in diameter on image 76/5.

Dependent subsegmental atelectasis in both lower lobes. 0.4 by
cm subpleural nodule in the right lower lobe on image 90/5.
Subsegmental atelectasis in the lingula.

Trace right pleural effusion.

Musculoskeletal: Transverse fracture the right posterior eleventh
rib with 1 bone width displacement, image 128/5. No appreciable
pneumothorax. Mild anterior wedging of T11 is not entirely specific
but is probably chronic.

Small masses with speckled calcifications in both breasts, probably
benign fibroadenomas or similar benign lesions.

CT ABDOMEN PELVIS FINDINGS

Hepatobiliary: Mild diffuse intrahepatic biliary dilatation. Common
bile duct borderline prominent at 7 mm diameter. The gallbladder is
surgically absent.

Pancreas: Unremarkable

Spleen: Unremarkable

Adrenals/Urinary Tract: Unremarkable

Stomach/Bowel: Unremarkable

Vascular/Lymphatic: Aortoiliac atherosclerotic vascular disease.

Reproductive: Uterus absent.  Adnexa unremarkable.

Other: No supplemental non-categorized findings.

Musculoskeletal: Lower lumbar degenerative facet arthropathy.
IMPRESSION: 1. Posterior fracture of the right eleventh rib demonstrates 1 bone
width of displacement. Trace adjacent pleural effusion. No
pneumothorax.
2. 6 mm anterior subpleural nodule in the right middle lobe. 3 by 4
mm subpleural nodule in the right lower lobe. Non-contrast chest CT
at 6-12 months is recommended. If the nodule is stable at time of
repeat CT, then future CT at 18-24 months (from today's scan) is
considered optional for low-risk patients, but is recommended for
high-risk patients. This recommendation follows the consensus
statement: Guidelines for Management of Incidental Pulmonary Nodules
Detected on CT Images: From the [HOSPITAL] 9188; Radiology
9188; [DATE].
3. Coarsely calcified bilateral breast lesions, probably
fibroadenomas or similar benign lesions. The last breast imaging we
have on file for the patient is in 5699; confirmation that the
patient is undergoing routine breast screening is recommended.
4.  Aortic Atherosclerosis ([id number].G).
5. Mild intrahepatic biliary dilatation and borderline prominence of
the common bile duct. Much of this may represent a physiologic
response to cholecystectomy.

## 2020-01-15 ENCOUNTER — Encounter: Payer: Self-pay | Admitting: Adult Health

## 2020-01-15 ENCOUNTER — Other Ambulatory Visit: Payer: Self-pay | Admitting: Adult Health

## 2020-01-15 ENCOUNTER — Ambulatory Visit (INDEPENDENT_AMBULATORY_CARE_PROVIDER_SITE_OTHER): Payer: 59 | Admitting: Adult Health

## 2020-01-15 ENCOUNTER — Other Ambulatory Visit: Payer: Self-pay

## 2020-01-15 VITALS — BP 124/72 | Ht 61.5 in | Wt 210.0 lb

## 2020-01-15 DIAGNOSIS — G40309 Generalized idiopathic epilepsy and epileptic syndromes, not intractable, without status epilepticus: Secondary | ICD-10-CM | POA: Diagnosis not present

## 2020-01-15 MED ORDER — DIVALPROEX SODIUM 500 MG PO DR TAB: 1500.0000 mg | DELAYED_RELEASE_TABLET | Freq: Two times a day (BID) | ORAL | 3 refills | Status: DC

## 2020-01-15 MED ORDER — PHENYTOIN SODIUM EXTENDED 30 MG PO CAPS: ORAL_CAPSULE | ORAL | 3 refills | Status: DC

## 2020-01-15 MED ORDER — LEVETIRACETAM 500 MG PO TABS: ORAL_TABLET | ORAL | 3 refills | Status: DC

## 2020-01-15 MED ORDER — PHENYTOIN SODIUM EXTENDED 100 MG PO CAPS: 200.0000 mg | ORAL_CAPSULE | Freq: Two times a day (BID) | ORAL | 3 refills | Status: DC

## 2020-01-15 NOTE — Patient Instructions (Addendum)
Continue Dilantin, Depakote and Keppra Blood work today If you have any seizure events please let us know.

## 2020-01-15 NOTE — Progress Notes (Signed)
PATIENT: Diana Vega DOB: 07-22-1955  REASON FOR VISIT: follow up HISTORY FROM: patient  HISTORY OF PRESENT ILLNESS: Today 01/15/20:  Diana Vega is a 64 year old female with a history of seizures.  She returns today for follow-up.  Overall she feels that she is doing well.  She is not had any seizure events.  Remains on Dilantin, Depakote and Keppra.  Denies any new symptoms.  Returns today for an evaluation.  HISTORY 01/11/19: Diana Vega is a 64 year old female with a history of seizures.  She returns today for follow-up.  She reports that she has been doing well.  She continues on Dilantin, Depakote and Keppra.  She denies any seizure events.  She denies any changes in her gait or balance.  No change in her mood or behavior.  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  She returns today for an evaluation.   REVIEW OF SYSTEMS: Out of a complete 14 system review of symptoms, the patient complains only of the following symptoms, and all other reviewed systems are negative.   See HPI  ALLERGIES: Allergies  Allergen Reactions  . Dilaudid [Hydromorphone Hcl]     acute renal failure   . Codeine     REACTION: GI upsets  . Eggs Or Egg-Derived Products   . Hydrocodone-Acetaminophen     Percocet- REACTION: GI upsets  . Toradol [Ketorolac Tromethamine]     ? Acute renal failure.  Deirdre Peer Hcl] Nausea And Vomiting    HOME MEDICATIONS: Outpatient Medications Prior to Visit  Medication Sig Dispense Refill  . divalproex (DEPAKOTE) 500 MG DR tablet Take 3 tablets (1,500 mg total) by mouth 2 (two) times daily. 540 tablet 3  . ibuprofen (ADVIL,MOTRIN) 200 MG tablet Take 600 mg by mouth every 6 (six) hours as needed for headache or mild pain.    Marland Kitchen levETIRAcetam (KEPPRA) 500 MG tablet take 1 tab in the am 2 tabs at night 270 tablet 3  . phenytoin (DILANTIN) 100 MG ER capsule Take 2 capsules (200 mg total) by mouth 2 (two) times daily. 360 capsule 3  . phenytoin  (DILANTIN) 30 MG ER capsule TAKE 1 CAPSULE (30 MG TOTAL) BY MOUTH AT BEDTIME. 90 capsule 3   No facility-administered medications prior to visit.    PAST MEDICAL HISTORY: Past Medical History:  Diagnosis Date  . Meningitis   . Seizure [facility])    seizure 11/10/17    PAST SURGICAL HISTORY: Past Surgical History:  Procedure Laterality Date  . CHOLECYSTECTOMY      FAMILY HISTORY: Family History  Problem Relation Age of Onset  . Cancer Mother        colon  . Cancer Father        Breast  . Cancer Brother        Prostate  . Kidney failure Brother   . Hepatitis C Brother   . Cancer Brother        Prostate    SOCIAL HISTORY: Social History   Socioeconomic History  . Marital status: Married    Spouse name: Not on file  . Number of children: 4  . Years of education: College  . Highest education level: Not on file  Occupational History  . Occupation: Teacher, adult education: [employer]  Tobacco Use  . Smoking status: Current Every Day Smoker    Packs/day: 0.50    Types: Cigarettes  . Smokeless tobacco: Never Used  Substance and Sexual Activity  .  Alcohol use: No    Alcohol/week: 0.0 standard drinks  . Drug use: No  . Sexual activity: Not on file  Other Topics Concern  . Not on file  Social History Narrative   Patient lives at home with her husband.    Patient has 4 children.    Patient drinks 3 cups of caffeine daily.    Patient works as a Charity fundraiser for [employer].    Social Determinants of Health   Financial Resource Strain:   . Difficulty of Paying Living Expenses: Not on file  Food Insecurity:   . Worried About Programme researcher, broadcasting/film/video in the Last Year: Not on file  . Ran Out of Food in the Last Year: Not on file  Transportation Needs:   . Lack of Transportation (Medical): Not on file  . Lack of Transportation (Non-Medical): Not on file  Physical Activity:   . Days of Exercise per Week: Not on file  . Minutes of Exercise per Session: Not on file  Stress:   . Feeling of  Stress : Not on file  Social Connections:   . Frequency of Communication with Friends and Family: Not on file  . Frequency of Social Gatherings with Friends and Family: Not on file  . Attends Religious Services: Not on file  . Active Member of Clubs or Organizations: Not on file  . Attends Banker Meetings: Not on file  . Marital Status: Not on file  Intimate Partner Violence:   . Fear of Current or Ex-Partner: Not on file  . Emotionally Abused: Not on file  . Physically Abused: Not on file  . Sexually Abused: Not on file      PHYSICAL EXAM  Vitals:   01/15/20 1452  BP: 124/72  Weight: 210 lb (95.3 kg)  Height: 5' 1.5" (1.562 m)   Body mass index is 39.04 kg/m.  Generalized: Well developed, in no acute distress   Neurological examination  Mentation: Alert oriented to time, place, history taking. Follows all commands speech and language fluent Cranial nerve II-XII: Pupils were equal round reactive to light. Extraocular movements were full, visual field were full on confrontational test. Facial sensation and strength were normal.  were normal and symmetric. Motor: The motor testing reveals 5 over 5 strength of all 4 extremities. Good symmetric motor tone is noted throughout.  Sensory: Sensory testing is intact to soft touch on all 4 extremities. No evidence of extinction is noted.  Coordination: Cerebellar testing reveals good finger-nose-finger and heel-to-shin bilaterally.  Gait and station: Gait is normal. Reflexes: Deep tendon reflexes are symmetric and normal bilaterally.   DIAGNOSTIC DATA (LABS, IMAGING, TESTING) - I reviewed patient records, labs, notes, testing and imaging myself where available.  Lab Results  Component Value Date   WBC 8.1 01/11/2019   HGB 13.6 01/11/2019   HCT 41.2 01/11/2019   MCV 91 01/11/2019   PLT 255 01/11/2019      Component Value Date/Time   NA 140 01/11/2019 1512   K 4.5 01/11/2019 1512   CL 100 01/11/2019 1512   CO2  25 01/11/2019 1512   GLUCOSE 78 01/11/2019 1512   GLUCOSE 110 (H) 11/11/2017 0512   BUN 11 01/11/2019 1512   CREATININE 0.60 01/11/2019 1512   CALCIUM 9.0 01/11/2019 1512   PROT 6.0 01/11/2019 1512   ALBUMIN 3.7 (L) 01/11/2019 1512   AST 12 01/11/2019 1512   ALT 7 01/11/2019 1512   ALKPHOS 80 01/11/2019 1512   BILITOT <0.2 01/11/2019  1512   GFRNONAA 98 01/11/2019 1512   GFRAA 113 01/11/2019 1512   Lab Results  Component Value Date   CHOL 238 (H) 02/07/2010   HDL 82.00 02/07/2010   LDLDIRECT 120.2 02/07/2010   TRIG 112.0 02/07/2010   CHOLHDL 3 02/07/2010   No results found for: HGBA1C No results found for: VITAMINB12 Lab Results  Component Value Date   TSH 0.84 02/07/2010      ASSESSMENT AND PLAN 64 y.o. year old female  has a past medical history of Meningitis and Seizure (HCC). here with:  1. Seizures  -Continue Dilantin, Depakote and Keppra -Blood work today -Advised if she has any seizure event she should let us know -Follow-up in 1 year or sooner if needed  I spent 20 minutes of face-to-face and non-face-to-face time with patient.  This included previsit chart review, lab review, study review, order entry, electronic health record documentation, patient education.  Butch Penny, MSN, NP-C 01/15/2020, 3:05 PM [facility] [address], [address] [place], [place] [address] [phone]

## 2020-01-16 LAB — COMPREHENSIVE METABOLIC PANEL
ALT: 16 IU/L (ref 0–32)
AST: 19 IU/L (ref 0–40)
Albumin/Globulin Ratio: 1.8 (ref 1.2–2.2)
Albumin: 4 g/dL (ref 3.8–4.8)
Alkaline Phosphatase: 90 IU/L (ref 44–121)
BUN/Creatinine Ratio: 28 (ref 12–28)
BUN: 15 mg/dL (ref 8–27)
Bilirubin Total: <0.2 mg/dL (ref 0.0–1.2)
CO2: 26 mmol/L (ref 20–29)
Calcium: 9.1 mg/dL (ref 8.7–10.3)
Chloride: 95 mmol/L — ABNORMAL LOW (ref 96–106)
Creatinine, Ser: 0.53 mg/dL — ABNORMAL LOW (ref 0.57–1.00)
GFR calc Af Amer: 117 mL/min/{1.73_m2} (ref >59)
GFR calc non Af Amer: 101 mL/min/{1.73_m2} (ref >59)
Globulin, Total: 2.2 g/dL (ref 1.5–4.5)
Glucose: 97 mg/dL (ref 65–99)
Potassium: 4.9 mmol/L (ref 3.5–5.2)
Sodium: 133 mmol/L — ABNORMAL LOW (ref 134–144)
Total Protein: 6.2 g/dL (ref 6.0–8.5)

## 2020-01-16 LAB — CBC WITH DIFFERENTIAL/PLATELET
Basophils Absolute: 0.1 10*3/uL (ref 0.0–0.2)
Basos: 0 %
EOS (ABSOLUTE): 0.4 10*3/uL (ref 0.0–0.4)
Eos: 3 %
Hematocrit: 37.7 % (ref 34.0–46.6)
Hemoglobin: 12.9 g/dL (ref 11.1–15.9)
Immature Grans (Abs): 0 10*3/uL (ref 0.0–0.1)
Immature Granulocytes: 0 %
Lymphocytes Absolute: 3.8 10*3/uL — ABNORMAL HIGH (ref 0.7–3.1)
Lymphs: 34 %
MCH: 31.6 pg (ref 26.6–33.0)
MCHC: 34.2 g/dL (ref 31.5–35.7)
MCV: 92 fL (ref 79–97)
Monocytes Absolute: 0.9 10*3/uL (ref 0.1–0.9)
Monocytes: 8 %
Neutrophils Absolute: 6 10*3/uL (ref 1.4–7.0)
Neutrophils: 55 %
Platelets: 241 10*3/uL (ref 150–450)
RBC: 4.08 x10E6/uL (ref 3.77–5.28)
RDW: 13.5 % (ref 11.7–15.4)
WBC: 11.2 10*3/uL — ABNORMAL HIGH (ref 3.4–10.8)

## 2020-01-16 LAB — PHENYTOIN LEVEL, TOTAL: Phenytoin (Dilantin), Serum: 5.6 ug/mL — ABNORMAL LOW (ref 10.0–20.0)

## 2020-01-16 LAB — VALPROIC ACID LEVEL: Valproic Acid Lvl: 83 ug/mL (ref 50–100)

## 2020-02-25 DIAGNOSIS — Z20822 Contact with and (suspected) exposure to covid-19: Secondary | ICD-10-CM | POA: Diagnosis not present

## 2020-02-25 DIAGNOSIS — Z03818 Encounter for observation for suspected exposure to other biological agents ruled out: Secondary | ICD-10-CM | POA: Diagnosis not present

## 2020-04-05 ENCOUNTER — Other Ambulatory Visit: Payer: Self-pay | Admitting: Adult Health

## 2020-04-08 MED FILL — PHENYTOIN SODIUM EXTENDED 1: 100 | 90 days supply | Qty: 360 | Fill #0

## 2020-04-08 MED FILL — DILANTIN 30 MG CAPSULE: 30 | 90 days supply | Qty: 90 | Fill #0

## 2020-04-08 MED FILL — levETIRAcetam 500 MG TABS: 500 | 90 days supply | Qty: 270 | Fill #0

## 2020-04-08 MED FILL — DIVALPROEX SOD DR 500 MG TA: 500 | 90 days supply | Qty: 540 | Fill #0

## 2020-09-05 ENCOUNTER — Other Ambulatory Visit (HOSPITAL_COMMUNITY): Payer: Self-pay

## 2020-09-05 MED FILL — Levetiracetam Tab 500 MG: ORAL | 30 days supply | Qty: 90 | Fill #0 | Status: AC

## 2020-09-05 MED FILL — Divalproex Sodium Tab Delayed Release 500 MG: ORAL | 30 days supply | Qty: 180 | Fill #0 | Status: AC

## 2020-09-05 MED FILL — Phenytoin Sodium Extended Cap 100 MG: ORAL | 30 days supply | Qty: 120 | Fill #0 | Status: AC

## 2020-09-05 MED FILL — Phenytoin Sodium Extended Cap 30 MG: ORAL | 30 days supply | Qty: 30 | Fill #0 | Status: AC

## 2020-09-18 DIAGNOSIS — Z20822 Contact with and (suspected) exposure to covid-19: Secondary | ICD-10-CM | POA: Diagnosis not present

## 2020-10-24 ENCOUNTER — Other Ambulatory Visit (HOSPITAL_COMMUNITY): Payer: Self-pay

## 2020-10-24 MED FILL — Phenytoin Sodium Extended Cap 30 MG: ORAL | 30 days supply | Qty: 30 | Fill #1 | Status: AC

## 2020-10-24 MED FILL — Divalproex Sodium Tab Delayed Release 500 MG: ORAL | 30 days supply | Qty: 180 | Fill #1 | Status: AC

## 2020-10-24 MED FILL — Phenytoin Sodium Extended Cap 100 MG: ORAL | 30 days supply | Qty: 120 | Fill #1 | Status: AC

## 2020-10-24 MED FILL — Levetiracetam Tab 500 MG: ORAL | 30 days supply | Qty: 90 | Fill #1 | Status: AC

## 2020-10-24 MED FILL — Levetiracetam Tab 500 MG: ORAL | 30 days supply | Qty: 90 | Fill #1 | Status: CN

## 2020-10-25 ENCOUNTER — Other Ambulatory Visit (HOSPITAL_COMMUNITY): Payer: Self-pay

## 2020-12-11 ENCOUNTER — Other Ambulatory Visit (HOSPITAL_COMMUNITY): Payer: Self-pay

## 2020-12-11 MED FILL — Phenytoin Sodium Extended Cap 30 MG: ORAL | 30 days supply | Qty: 30 | Fill #2 | Status: AC

## 2020-12-11 MED FILL — Divalproex Sodium Tab Delayed Release 500 MG: ORAL | 30 days supply | Qty: 180 | Fill #2 | Status: AC

## 2020-12-11 MED FILL — Phenytoin Sodium Extended Cap 100 MG: ORAL | 30 days supply | Qty: 120 | Fill #2 | Status: AC

## 2020-12-11 MED FILL — Levetiracetam Tab 500 MG: ORAL | 30 days supply | Qty: 90 | Fill #2 | Status: AC

## 2021-01-20 ENCOUNTER — Encounter: Payer: Self-pay | Admitting: Adult Health

## 2021-01-20 ENCOUNTER — Other Ambulatory Visit: Payer: Self-pay

## 2021-01-20 ENCOUNTER — Ambulatory Visit (INDEPENDENT_AMBULATORY_CARE_PROVIDER_SITE_OTHER): Payer: 59 | Admitting: Adult Health

## 2021-01-20 VITALS — BP 130/73 | HR 69 | Ht 62.0 in | Wt 189.0 lb

## 2021-01-20 DIAGNOSIS — G40309 Generalized idiopathic epilepsy and epileptic syndromes, not intractable, without status epilepticus: Secondary | ICD-10-CM

## 2021-01-20 DIAGNOSIS — Z5181 Encounter for therapeutic drug level monitoring: Secondary | ICD-10-CM | POA: Diagnosis not present

## 2021-01-20 NOTE — Patient Instructions (Signed)
-  Continue Dilantin 200 mg twice a day - Continue  Depakote 1500 mg twice a day - COntinue  Keppra 500 mg in the morning and at 1000 mg in the evening -Blood work today

## 2021-01-20 NOTE — Progress Notes (Signed)
PATIENT: Diana Vega DOB: 04/30/1956  REASON FOR VISIT: follow up HISTORY FROM: patient  HISTORY OF PRESENT ILLNESS: Today 01/20/21:  Ms. Trevathan is a 65 year old female with a history of seizures.  She returns today for follow-up.  She has not had any seizure events.  Remains on Dilantin, Depakote and Keppra.  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  Returns today for evaluation.  01/15/20: Ms. Pernice is a 65 year old female with a history of seizures.  She returns today for follow-up.  Overall she feels that she is doing well.  She is not had any seizure events.  Remains on Dilantin, Depakote and Keppra.  Denies any new symptoms.  Returns today for an evaluation.  HISTORY 01/11/19: Ms. Lighty is a 65 year old female with a history of seizures.  She returns today for follow-up.  She reports that she has been doing well.  She continues on Dilantin, Depakote and Keppra.  She denies any seizure events.  She denies any changes in her gait or balance.  No change in her mood or behavior.  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  She returns today for an evaluation.    REVIEW OF SYSTEMS: Out of a complete 14 system review of symptoms, the patient complains only of the following symptoms, and all other reviewed systems are negative.   See HPI  ALLERGIES: Allergies  Allergen Reactions   Dilaudid [Hydromorphone Hcl]     acute renal failure    Codeine     REACTION: GI upsets   Eggs Or Egg-Derived Products    Hydrocodone-Acetaminophen     Percocet- REACTION: GI upsets   Toradol [Ketorolac Tromethamine]     ? Acute renal failure.   Zofran [Ondansetron Hcl] Nausea And Vomiting    HOME MEDICATIONS: Outpatient Medications Prior to Visit  Medication Sig Dispense Refill   divalproex (DEPAKOTE) 500 MG DR tablet TAKE 3 TABLETS BY MOUTH 2 TIMES DAILY 540 tablet 3   ibuprofen (ADVIL,MOTRIN) 200 MG tablet Take 600 mg by mouth every 6 (six) hours as  needed for headache or mild pain.     levETIRAcetam (KEPPRA) 500 MG tablet TAKE 1 TABLET BY MOUTH EVERY MORNING AND TAKE 2 TABLETS BY MOUTH EVERY NIGHT AT BEDTIME 270 tablet 3   phenytoin (DILANTIN) 100 MG ER capsule TAKE 2 CAPSULES BY MOUTH 2 TIMES DAILY 360 capsule 3   phenytoin (DILANTIN) 30 MG ER capsule TAKE 1 CAPSULE BY MOUTH AT BEDTIME 90 capsule 3   No facility-administered medications prior to visit.    PAST MEDICAL HISTORY: Past Medical History:  Diagnosis Date   Meningitis    Seizure (HCC)    seizure 11/10/17    PAST SURGICAL HISTORY: Past Surgical History:  Procedure Laterality Date   CHOLECYSTECTOMY      FAMILY HISTORY: Family History  Problem Relation Age of Onset   Cancer Mother        colon   Cancer Father        Breast   Cancer Brother        Prostate   Kidney failure Brother    Hepatitis C Brother    Cancer Brother        Prostate    SOCIAL HISTORY: Social History   Socioeconomic History   Marital status: Married    Spouse name: Not on file   Number of children: 4   Years of education: College   Highest education level: Not on  file  Occupational History   Occupation: Teacher, adult education: [employer]  Tobacco Use   Smoking status: Every Day    Packs/day: 0.50    Types: Cigarettes   Smokeless tobacco: Never  Vaping Use   Vaping Use: Never used  Substance and Sexual Activity   Alcohol use: No    Alcohol/week: 0.0 standard drinks   Drug use: No   Sexual activity: Not on file  Other Topics Concern   Not on file  Social History Narrative   Patient lives at home with her husband.    Patient has 4 children.    Patient drinks 3 cups of caffeine daily.    Patient works as a Charity fundraiser for [employer].    Social Determinants of Health   Financial Resource Strain: Not on file  Food Insecurity: Not on file  Transportation Needs: Not on file  Physical Activity: Not on file  Stress: Not on file  Social Connections: Not on file  Intimate Partner  Violence: Not on file      PHYSICAL EXAM  Vitals:   01/20/21 1501  BP: 130/73  Pulse: 69  Weight: 189 lb (85.7 kg)  Height: 5\' 2"  (1.575 m)   Body mass index is 34.57 kg/m.  Generalized: Well developed, in no acute distress   Neurological examination  Mentation: Alert oriented to time, place, history taking. Follows all commands speech and language fluent Cranial nerve II-XII: Pupils were equal round reactive to light. Extraocular movements were full, visual field were full on confrontational test. Facial sensation and strength were normal.  were normal and symmetric. Motor: The motor testing reveals 5 over 5 strength of all 4 extremities. Good symmetric motor tone is noted throughout.  Sensory: Sensory testing is intact to soft touch on all 4 extremities. No evidence of extinction is noted.  Coordination: Cerebellar testing reveals good finger-nose-finger and heel-to-shin bilaterally.  Gait and station: Gait is normal. Reflexes: Deep tendon reflexes are symmetric and normal bilaterally.   DIAGNOSTIC DATA (LABS, IMAGING, TESTING) - I reviewed patient records, labs, notes, testing and imaging myself where available.  Lab Results  Component Value Date   WBC 11.2 (H) 01/15/2020   HGB 12.9 01/15/2020   HCT 37.7 01/15/2020   MCV 92 01/15/2020   PLT 241 01/15/2020      Component Value Date/Time   NA 133 (L) 01/15/2020 1521   K 4.9 01/15/2020 1521   CL 95 (L) 01/15/2020 1521   CO2 26 01/15/2020 1521   GLUCOSE 97 01/15/2020 1521   GLUCOSE 110 (H) 11/11/2017 0512   BUN 15 01/15/2020 1521   CREATININE 0.53 (L) 01/15/2020 1521   CALCIUM 9.1 01/15/2020 1521   PROT 6.2 01/15/2020 1521   ALBUMIN 4.0 01/15/2020 1521   AST 19 01/15/2020 1521   ALT 16 01/15/2020 1521   ALKPHOS 90 01/15/2020 1521   BILITOT <0.2 01/15/2020 1521   GFRNONAA 101 01/15/2020 1521   GFRAA 117 01/15/2020 1521   Lab Results  Component Value Date   CHOL 238 (H) 02/07/2010   HDL 82.00 02/07/2010    LDLDIRECT 120.2 02/07/2010   TRIG 112.0 02/07/2010   CHOLHDL 3 02/07/2010   No results found for: HGBA1C No results found for: VITAMINB12 Lab Results  Component Value Date   TSH 0.84 02/07/2010      ASSESSMENT AND PLAN 65 y.o. year old female  has a past medical history of Meningitis and Seizure (HCC). here with:  1. Seizures  -Continue Dilantin 230  mg twice a day - Continue  Depakote 1500 mg twice a day - COntinue  Keppra 500 mg in the morning and at 1000 mg in the evening -Blood work today -Advised if she has any seizure event she should let us know -Follow-up in 1 year or sooner if needed  I spent 20 minutes of face-to-face and non-face-to-face time with patient.  This included previsit chart review, lab review, study review, order entry, electronic health record documentation, patient education.  Butch Penny, MSN, NP-C 01/20/2021, 3:10 PM [facility] [address] [place], [place] [address] [phone]

## 2021-01-21 LAB — CBC WITH DIFFERENTIAL/PLATELET
Basophils Absolute: 0.1 10*3/uL (ref 0.0–0.2)
Basos: 1 %
EOS (ABSOLUTE): 0.2 10*3/uL (ref 0.0–0.4)
Eos: 2 %
Hematocrit: 39.3 % (ref 34.0–46.6)
Hemoglobin: 13.5 g/dL (ref 11.1–15.9)
Immature Grans (Abs): 0 10*3/uL (ref 0.0–0.1)
Immature Granulocytes: 0 %
Lymphocytes Absolute: 3.7 10*3/uL — ABNORMAL HIGH (ref 0.7–3.1)
Lymphs: 43 %
MCH: 30.7 pg (ref 26.6–33.0)
MCHC: 34.4 g/dL (ref 31.5–35.7)
MCV: 89 fL (ref 79–97)
Monocytes Absolute: 0.8 10*3/uL (ref 0.1–0.9)
Monocytes: 9 %
Neutrophils Absolute: 4 10*3/uL (ref 1.4–7.0)
Neutrophils: 45 %
Platelets: 267 10*3/uL (ref 150–450)
RBC: 4.4 x10E6/uL (ref 3.77–5.28)
RDW: 12.1 % (ref 11.7–15.4)
WBC: 8.7 10*3/uL (ref 3.4–10.8)

## 2021-01-21 LAB — COMPREHENSIVE METABOLIC PANEL WITH GFR
ALT: 9 [IU]/L (ref 0–32)
AST: 15 [IU]/L (ref 0–40)
Albumin/Globulin Ratio: 1.5 (ref 1.2–2.2)
Albumin: 3.8 g/dL (ref 3.8–4.8)
Alkaline Phosphatase: 98 [IU]/L (ref 44–121)
BUN/Creatinine Ratio: 21 (ref 12–28)
BUN: 12 mg/dL (ref 8–27)
Bilirubin Total: <0.2 mg/dL (ref 0.0–1.2)
CO2: 24 mmol/L (ref 20–29)
Calcium: 9.2 mg/dL (ref 8.7–10.3)
Chloride: 102 mmol/L (ref 96–106)
Creatinine, Ser: 0.56 mg/dL — ABNORMAL LOW (ref 0.57–1.00)
Globulin, Total: 2.5 g/dL (ref 1.5–4.5)
Glucose: 81 mg/dL (ref 65–99)
Potassium: 4.5 mmol/L (ref 3.5–5.2)
Sodium: 139 mmol/L (ref 134–144)
Total Protein: 6.3 g/dL (ref 6.0–8.5)
eGFR: 102 mL/min/{1.73_m2}

## 2021-01-21 LAB — PHENYTOIN LEVEL, TOTAL: Phenytoin (Dilantin), Serum: 6.3 ug/mL — ABNORMAL LOW (ref 10.0–20.0)

## 2021-01-21 LAB — VALPROIC ACID LEVEL: Valproic Acid Lvl: 74 ug/mL (ref 50–100)

## 2021-01-24 ENCOUNTER — Other Ambulatory Visit: Payer: Self-pay | Admitting: Adult Health

## 2021-01-24 ENCOUNTER — Telehealth: Payer: Self-pay | Admitting: Adult Health

## 2021-01-24 ENCOUNTER — Other Ambulatory Visit (HOSPITAL_COMMUNITY): Payer: Self-pay

## 2021-01-24 MED FILL — Phenytoin Sodium Extended Cap 30 MG: ORAL | 30 days supply | Qty: 30 | Fill #3 | Status: AC

## 2021-01-24 MED FILL — Divalproex Sodium Tab Delayed Release 500 MG: ORAL | 30 days supply | Qty: 180 | Fill #3 | Status: AC

## 2021-01-24 MED FILL — Levetiracetam Tab 500 MG: ORAL | 30 days supply | Qty: 90 | Fill #3 | Status: AC

## 2021-01-24 MED FILL — Phenytoin Sodium Extended Cap 100 MG: ORAL | 30 days supply | Qty: 120 | Fill #3 | Status: AC

## 2021-01-24 NOTE — Telephone Encounter (Signed)
Pt request refill divalproex (DEPAKOTE) 500 MG DR tablet , levETIRAcetam (KEPPRA) 500 MG tablet, phenytoin (DILANTIN) 100 MG ER capsule,  phenytoin (DILANTIN) 30 MG ER capsule at [facility].  Pt stated is out of medication

## 2021-01-27 ENCOUNTER — Other Ambulatory Visit (HOSPITAL_COMMUNITY): Payer: Self-pay

## 2021-01-27 MED ORDER — DIVALPROEX SODIUM 500 MG PO DR TAB
1500.0000 mg | DELAYED_RELEASE_TABLET | Freq: Two times a day (BID) | ORAL | 3 refills | Status: DC
  Filled 2021-01-27 – 2021-03-06 (×2): qty 540, 90d supply, fill #0

## 2021-01-27 MED ORDER — PHENYTOIN SODIUM EXTENDED 30 MG PO CAPS
30.0000 mg | ORAL_CAPSULE | Freq: Every day | ORAL | 3 refills | Status: DC
  Filled 2021-01-27 – 2021-03-06 (×2): qty 90, 90d supply, fill #0

## 2021-01-27 MED ORDER — LEVETIRACETAM 500 MG PO TABS
ORAL_TABLET | ORAL | 3 refills | Status: DC
  Filled 2021-01-27 – 2021-03-06 (×2): qty 270, 90d supply, fill #0

## 2021-01-27 MED ORDER — PHENYTOIN SODIUM EXTENDED 100 MG PO CAPS
200.0000 mg | ORAL_CAPSULE | Freq: Two times a day (BID) | ORAL | 3 refills | Status: DC
  Filled 2021-01-27 – 2021-03-06 (×2): qty 360, 90d supply, fill #0

## 2021-01-27 NOTE — Telephone Encounter (Signed)
Refills sent to pharmacy. 

## 2021-03-07 ENCOUNTER — Other Ambulatory Visit (HOSPITAL_COMMUNITY): Payer: Self-pay

## 2021-03-10 ENCOUNTER — Other Ambulatory Visit (HOSPITAL_COMMUNITY): Payer: Self-pay

## 2021-05-05 ENCOUNTER — Other Ambulatory Visit: Payer: Self-pay

## 2021-07-10 ENCOUNTER — Other Ambulatory Visit (HOSPITAL_COMMUNITY): Payer: Self-pay

## 2021-07-11 ENCOUNTER — Other Ambulatory Visit (HOSPITAL_COMMUNITY): Payer: Self-pay

## 2022-01-20 ENCOUNTER — Telehealth (INDEPENDENT_AMBULATORY_CARE_PROVIDER_SITE_OTHER): Payer: Medicare Other | Admitting: Adult Health

## 2022-01-20 DIAGNOSIS — G40309 Generalized idiopathic epilepsy and epileptic syndromes, not intractable, without status epilepticus: Secondary | ICD-10-CM

## 2022-01-20 DIAGNOSIS — Z5181 Encounter for therapeutic drug level monitoring: Secondary | ICD-10-CM

## 2022-01-20 NOTE — Progress Notes (Signed)
PATIENT: Diana Vega DOB: Mar 19, 1956  REASON FOR VISIT: follow up HISTORY FROM: patient  Virtual Visit via Video Note  I connected with Roanoke Cellar on 01/20/22 at  3:15 PM EDT by a video enabled telemedicine application located remotely at [facility] and verified that I am speaking with the correct person using two identifiers who was located at their own home.   I discussed the limitations of evaluation and management by telemedicine and the availability of in person appointments. The patient expressed understanding and agreed to proceed.   PATIENT: Diana Vega DOB: 23-Oct-1955  REASON FOR VISIT: follow up HISTORY FROM: patient  HISTORY OF PRESENT ILLNESS: Today 01/20/22:  Ms. Diana Vega is a 66 year old female with a history of seizures.  She returns today for virtual visit.  She remains on Dilantin Depakote and Keppra.  Denies any seizure events.  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  She returns today for an evaluation.  01/20/21:   Ms. Diana Vega is a 66 year old female with a history of seizures.  She returns today for follow-up.  She has not had any seizure events.  Remains on Dilantin, Depakote and Keppra.  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  Returns today for evaluation.   01/15/20: Ms. Diana Vega is a 67 year old female with a history of seizures.  She returns today for follow-up.  Overall she feels that she is doing well.  She is not had any seizure events.  Remains on Dilantin, Depakote and Keppra.  Denies any new symptoms.  Returns today for an evaluation.   HISTORY 01/11/19: Ms. Diana Vega is a 66 year old female with a history of seizures.  She returns today for follow-up.  She reports that she has been doing well.  She continues on Dilantin, Depakote and Keppra.  She denies any seizure events.  She denies any changes in her gait or balance.  No change in her mood or behavior.   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  She returns today for an evaluation.  REVIEW OF SYSTEMS: Out of a complete 14 system review of symptoms, the patient complains only of the following symptoms, and all other reviewed systems are negative.  ALLERGIES: Allergies  Allergen Reactions   Dilaudid [Hydromorphone Hcl]     acute renal failure    Codeine     REACTION: GI upsets   Eggs Or Egg-Derived Products    Hydrocodone-Acetaminophen     Percocet- REACTION: GI upsets   Toradol [Ketorolac Tromethamine]     ? Acute renal failure.   Zofran [Ondansetron Hcl] Nausea And Vomiting    HOME MEDICATIONS: Outpatient Medications Prior to Visit  Medication Sig Dispense Refill   divalproex (DEPAKOTE) 500 MG DR tablet Take 3 tablets (1,500 mg total) by mouth 2 (two) times daily. 540 tablet 3   ibuprofen (ADVIL,MOTRIN) 200 MG tablet Take 600 mg by mouth every 6 (six) hours as needed for headache or mild pain.     levETIRAcetam (KEPPRA) 500 MG tablet Take 1 tablet (500 mg total) by mouth every morning AND 2 tablets (1,000 mg total) at bedtime. 270 tablet 3   phenytoin (DILANTIN) 100 MG ER capsule Take 2 capsules (200 mg total) by mouth 2 (two) times daily. 360 capsule 3   phenytoin (DILANTIN) 30 MG ER capsule Take 1 capsule (30 mg total) by mouth at bedtime. 90 capsule 3   No facility-administered medications prior to visit.  PAST MEDICAL HISTORY: Past Medical History:  Diagnosis Date   Meningitis    Seizure ([place])    seizure 11/10/17    PAST SURGICAL HISTORY: Past Surgical History:  Procedure Laterality Date   CHOLECYSTECTOMY      FAMILY HISTORY: Family History  Problem Relation Age of Onset   Cancer Mother        colon   Cancer Father        Breast   Cancer Brother        Prostate   Kidney failure Brother    Hepatitis C Brother    Cancer Brother        Prostate    SOCIAL HISTORY: Social History   Socioeconomic History   Marital status: Married    Spouse name:  Not on file   Number of children: 4   Years of education: College   Highest education level: Not on file  Occupational History   Occupation: Programmer, multimedia: [employer]  Tobacco Use   Smoking status: Every Day    Packs/day: 0.50    Types: Cigarettes   Smokeless tobacco: Never  Vaping Use   Vaping Use: Never used  Substance and Sexual Activity   Alcohol use: No    Alcohol/week: 0.0 standard drinks of alcohol   Drug use: No   Sexual activity: Not on file  Other Topics Concern   Not on file  Social History Narrative   Patient lives at home with her husband.    Patient has 4 children.    Patient drinks 3 cups of caffeine daily.    Patient works as a Therapist, sports for [employer].    Social Determinants of Health   Financial Resource Strain: Not on file  Food Insecurity: Not on file  Transportation Needs: Not on file  Physical Activity: Not on file  Stress: Not on file  Social Connections: Not on file  Intimate Partner Violence: Not on file      PHYSICAL EXAM Generalized: Well developed, in no acute distress   Neurological examination  Mentation: Alert oriented to time, place, history taking. Follows all commands speech and language fluent Cranial nerve II-XII:Extraocular movements were full. Facial symmetry noted.  Head turning and shoulder shrug  were normal and symmetric. Reflexes: UTA  DIAGNOSTIC DATA (LABS, IMAGING, TESTING) - I reviewed patient records, labs, notes, testing and imaging myself where available.  Lab Results  Component Value Date   WBC 8.7 01/20/2021   HGB 13.5 01/20/2021   HCT 39.3 01/20/2021   MCV 89 01/20/2021   PLT 267 01/20/2021      Component Value Date/Time   NA 139 01/20/2021 1530   K 4.5 01/20/2021 1530   CL 102 01/20/2021 1530   CO2 24 01/20/2021 1530   GLUCOSE 81 01/20/2021 1530   GLUCOSE 110 (H) 11/11/2017 0512   BUN 12 01/20/2021 1530   CREATININE 0.56 (L) 01/20/2021 1530   CALCIUM 9.2 01/20/2021 1530   PROT 6.3 01/20/2021 1530    ALBUMIN 3.8 01/20/2021 1530   AST 15 01/20/2021 1530   ALT 9 01/20/2021 1530   ALKPHOS 98 01/20/2021 1530   BILITOT <0.2 01/20/2021 1530   GFRNONAA 101 01/15/2020 1521   GFRAA 117 01/15/2020 1521   Lab Results  Component Value Date   CHOL 238 (H) 02/07/2010   HDL 82.00 02/07/2010   LDLDIRECT 120.2 02/07/2010   TRIG 112.0 02/07/2010   CHOLHDL 3 02/07/2010   No results found for: "HGBA1C" No results found  for: "ATFTDDUK02" Lab Results  Component Value Date   TSH 0.84 02/07/2010      ASSESSMENT AND PLAN 66 y.o. year old female  has a past medical history of Meningitis and Seizure (HCC). here with :  1. Seizures   -Continue Dilantin 230 mg twice a day - Continue  Depakote 1500 mg twice a day - COntinue  Keppra 500 mg in the morning and at 1000 mg in the evening -Blood work ordered -Advised if she has any seizure event she should let us know -Follow-up in 1 year or sooner if needed      Butch Penny, MSN, NP-C 01/20/2022, 3:17 PM [facility] [address] [place], [place] [address] [phone]

## 2022-03-01 ENCOUNTER — Other Ambulatory Visit: Payer: Self-pay | Admitting: Adult Health

## 2022-03-26 ENCOUNTER — Encounter (HOSPITAL_BASED_OUTPATIENT_CLINIC_OR_DEPARTMENT_OTHER): Payer: Self-pay | Admitting: Emergency Medicine

## 2022-03-26 ENCOUNTER — Other Ambulatory Visit: Payer: Self-pay

## 2022-03-26 ENCOUNTER — Emergency Department (HOSPITAL_BASED_OUTPATIENT_CLINIC_OR_DEPARTMENT_OTHER): Payer: Medicare Other | Admitting: Radiology

## 2022-03-26 ENCOUNTER — Emergency Department (HOSPITAL_BASED_OUTPATIENT_CLINIC_OR_DEPARTMENT_OTHER)
Admission: EM | Admit: 2022-03-26 | Discharge: 2022-03-26 | Disposition: A | Discharge status: Home / Self Care | Payer: Medicare Other | Attending: Emergency Medicine | Admitting: Emergency Medicine

## 2022-03-26 DIAGNOSIS — X58XXXA Exposure to other specified factors, initial encounter: Secondary | ICD-10-CM | POA: Insufficient documentation

## 2022-03-26 DIAGNOSIS — S92352A Displaced fracture of fifth metatarsal bone, left foot, initial encounter for closed fracture: Secondary | ICD-10-CM | POA: Insufficient documentation

## 2022-03-26 DIAGNOSIS — S99922A Unspecified injury of left foot, initial encounter: Secondary | ICD-10-CM | POA: Diagnosis present

## 2022-03-26 NOTE — Discharge Instructions (Addendum)
Thank you for coming to [facility] Emergency Department. You were seen for left foot pain. We did an exam, labs, and imaging, and these showed a fracture of your 5th metatarsal.  Please follow up with your orthopedic surgeon. You can call Dr. Magnus Ivan tomorrow morning to make an appointment at [phone] for tomorrow or early next week.  Please wear the CAM boot provided. You can bear weight on the leg as tolerated. Keep the leg elevated and you can alternate tylenol/ibuprofen for pain. Ice the foot as well.   Do not hesitate to return to the ED or call 911 if you experience: -Worsening pain -Numbness/tingling -Lightheadedness, passing out -Fevers/chills -Anything else that concerns you

## 2022-03-26 NOTE — ED Provider Notes (Signed)
[facility] EMERGENCY DEPT Provider Note   CSN: [id number] Arrival date & time: 03/26/22  1954     History  Chief Complaint  Patient presents with   Foot Pain    Diana Vega is a 66 y.o. female with GERD, epilepsy, h/o meningitis presents with right foot injury.  Patient reports twisting her left foot tonight due to an uneven surface; pt reports unable to bear weight and feels "cracking" when she walks in the foot itself.  Patient has fractured this foot in the past and has had to wear a cam boot, she has never had surgery on this ankle or foot.  She did not fall or hit her head.  HPI     Home Medications Prior to Admission medications   Medication Sig Start Date End Date Taking? Authorizing Provider  divalproex (DEPAKOTE) 500 MG DR tablet TAKE 3 TABLETS BY MOUTH TWICE DAILY 03/02/22   Butch Penny, NP  ibuprofen (ADVIL,MOTRIN) 200 MG tablet Take 600 mg by mouth every 6 (six) hours as needed for headache or mild pain.    [provider]  levETIRAcetam (KEPPRA) 500 MG tablet TAKE 1 TABLET BY MOUTH EVERY MORNING AND 2 AT BEDTIME 03/02/22   Butch Penny, NP  phenytoin (DILANTIN) 100 MG ER capsule Take 2 capsules (200 mg total) by mouth 2 (two) times daily. 01/27/21   Butch Penny, NP  phenytoin (DILANTIN) 30 MG ER capsule Take 1 capsule (30 mg total) by mouth at bedtime. 01/27/21   Butch Penny, NP      Allergies    Dilaudid [hydromorphone hcl], Codeine, Eggs or egg-derived products, Hydrocodone-acetaminophen, Toradol [ketorolac tromethamine], and Zofran [ondansetron hcl]    Review of Systems   Review of Systems Review of systems negative for head trauma.  A 10 point review of systems was performed and is negative unless otherwise reported in HPI.  Physical Exam Updated Vital Signs BP 138/65 (BP Location: Right Arm)   Pulse 89   Temp 98.3 F (36.8 C) (Oral)   Resp 18   Ht 5\' 2"  (1.575 m)   Wt 86.2 kg   SpO2 97%   BMI 34.75 kg/m   Physical Exam General: Normal appearing female, lying in bed.  HEENT: Sclera anicteric, MMM, trachea midline. Cardiology: RRR, no murmurs/rubs/gallops.  Resp: Normal respiratory rate and effort. Abd: Soft, non-distended.  GU: Deferred. MSK: TTP to the lateral forefoot of the LLE with mild edema and bruising. TTP of the 5th metatarsal. Intact ROM of the ankle/toes. Intact DP/PT pulses, intact distal cap refill. LLE compartments are soft.  Skin: warm, dry. No rashes or lesions. Neuro: A&Ox4, CNs II-XII grossly intact. MAEs. Psych: Normal mood and affect.   ED Results / Procedures / Treatments    Radiology DG Foot Complete Left  Result Date: 03/26/2022 CLINICAL DATA:  Fall, twisting injury. Lateral foot pain. EXAM: LEFT FOOT - COMPLETE 3+ VIEW COMPARISON:  None Available. FINDINGS: Mildly comminuted and minimally displaced fracture of the distal fifth metatarsal shaft. No intra-articular extension. No additional fracture. The alignment joint spaces are otherwise maintained. Soft tissue edema is seen at the fracture site. IMPRESSION: Mildly comminuted and minimally displaced distal fifth metatarsal shaft fracture. Electronically Signed   By: 03/28/2022 M.D.   On: 03/26/2022 21:03    Procedures Procedures    Medications Ordered in ED Medications - No data to display  ED Course/ Medical Decision Making/ A&P  Medical Decision Making Amount and/or Complexity of Data Reviewed Radiology: ordered. Decision-making details documented in ED Course.   Patient is hemodynamically stable and well-appearing overall.  Consider a 5th metatarsal fracture, fifth metatarsal avulsion fracture, Lisfranc fracture.  Will obtain x-rays to further evaluate.  Patient has no ankle tenderness or tenderness in the knee or remainder of her tib-fib.  She is neurovascularly intact and compartments are soft.  XRs demonstrate a mildly comminuted minimally displaced fifth metatarsal  shaft fracture of the distal shaft.  Patiently placed in a cam boot and be weightbearing as tolerated.  She is instructed to call orthopedic surgery in the morning to make a follow-up appointment.  Patient is given discharge instructions and return precautions, instructed to ice and elevate the affected extremity and take Tylenol ibuprofen for pain.  Dispo: DC in stable condition, all questions answered to be satisfaction.  I have personally reviewed and interpreted all imaging.   Clinical Course as of 03/26/22 2124  Thu Mar 26, 2022  2110 DG Foot Complete Left Mildly comminuted and minimally displaced distal fifth metatarsal shaft fracture.   [HN]    Clinical Course User Index [HN] Loetta Rough, MD          Final Clinical Impression(s) / ED Diagnoses Final diagnoses:  Displaced fracture of fifth metatarsal bone, left foot, initial encounter for closed fracture    Rx / DC Orders ED Discharge Orders     None        This note was created using dictation software, which may contain spelling or grammatical errors.    Loetta Rough, MD 03/26/22 2124

## 2022-03-26 NOTE — ED Triage Notes (Signed)
Pt reports twisting her left foot tonight due to an uneven surface; pt reports unable to bear weight and feels "cracking" when she walks

## 2022-04-06 ENCOUNTER — Encounter: Payer: Self-pay | Admitting: Physician Assistant

## 2022-04-06 ENCOUNTER — Ambulatory Visit (INDEPENDENT_AMBULATORY_CARE_PROVIDER_SITE_OTHER): Payer: Medicare Other

## 2022-04-06 ENCOUNTER — Ambulatory Visit (INDEPENDENT_AMBULATORY_CARE_PROVIDER_SITE_OTHER): Payer: Medicare Other | Admitting: Physician Assistant

## 2022-04-06 DIAGNOSIS — M79672 Pain in left foot: Secondary | ICD-10-CM | POA: Diagnosis not present

## 2022-04-06 NOTE — Progress Notes (Signed)
Office Visit Note   Patient: Diana Vega           Date of Birth: [birth year]           MRN: [id number] Visit Date: 04/06/2022              Requested by: Kirby Funk, MD [address] [place],  [place] [address] PCP: Kirby Funk, MD   Assessment & Plan: Visit Diagnoses:  1. Left foot pain     Plan: Spoke with her about smoking cessation or at least slowing down on the amount of smoking in the weight that nicotine inhibits bone growth.  Placed her in a Darco shoe which should wear anytime she is up and ambulating.  See her back in 4 weeks obtain 3 views of the left foot.  Spoke with her about the possibility of this going on to a nonunion but she would like to stay as conservative as possible.  Questions were encouraged and answered  Follow-Up Instructions: Return in about 4 weeks (around 05/04/2022) for Radiographs.   Orders:  Orders Placed This Encounter  Procedures   XR Foot Complete Left   No orders of the defined types were placed in this encounter.     Procedures: No procedures performed   Clinical Data: No additional findings.   Subjective: Chief Complaint  Patient presents with   Left Foot - Fracture    HPI Mrs. Diana Vega is a 66 year old female comes in today with a left foot fracture.  She reports on Thanksgiving day that she tripped on a brick hearth area while carrying a baby and she ended up twisting her foot.  She was seen in the ER on 03/26/2022 and at that point radiographs showed that she had a left foot fifth metatarsal fracture with minimal displacement.  She is placed in a cam walker boot appropriately.  She comes in today for follow-up. Review of Systems See HPI  Objective: Vital Signs: There were no vitals taken for this visit.  Physical Exam Constitutional:      Appearance: She is not ill-appearing or diaphoretic.  Cardiovascular:     Pulses: Normal pulses.  Pulmonary:     Effort: Pulmonary effort is normal.   Neurological:     Mental Status: She is alert and oriented to person, place, and time.     Ortho Exam Left foot: Dorsal pedal pulse 2+.  No rashes or lesions ulcerations or impending ulcers.  Area of ecchymosis over the dorsal foot and into the second and third toes.  Tenderness over the fifth metatarsal distally.  There is no tenting of the skin.  Remainder the foot is nontender.  Proximal tib-fib nontender.  Dorsiflexion plantarflexion left ankle intact. Specialty Comments:  No specialty comments available.  Imaging: XR Foot Complete Left  Result Date: 04/06/2022 Left foot 3 views: Oblique type fracture involving the distal fifth metatarsal shaft.  Slight displacement medially.  From a to P no displacement.  No other fractures identified.  No subluxations dislocations throughout the left foot.    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   Encounter for therapeutic drug monitoring 09/28/2013   ABNORMAL MAMMOGRAM, RIGHT BREAST 02/04/2010   GERD 01/27/2010   KNEE PAIN, RIGHT 01/27/2010   Convulsions (HCC) 01/27/2010   MENINGITIS, HX OF 01/27/2010   CHICKENPOX, HX OF 01/27/2010   Past Medical History:  Diagnosis Date   Meningitis  Seizure [facility])    seizure 11/10/17    Family History  Problem Relation Age of Onset   Cancer Mother        colon   Cancer Father        Breast   Cancer Brother        Prostate   Kidney failure Brother    Hepatitis C Brother    Cancer Brother        Prostate    Past Surgical History:  Procedure Laterality Date   ABDOMINAL HYSTERECTOMY     APPENDECTOMY     CHOLECYSTECTOMY     Social History   Occupational History   Occupation: Programmer, multimedia: [employer]  Tobacco Use   Smoking status: Every Day    Packs/day: 0.50    Types: Cigarettes   Smokeless tobacco: Never  Vaping Use   Vaping Use: Never used  Substance and Sexual Activity   Alcohol use: No     Alcohol/week: 0.0 standard drinks of alcohol   Drug use: No   Sexual activity: Not on file

## 2022-05-07 ENCOUNTER — Ambulatory Visit: Payer: Medicare Other | Admitting: Physician Assistant

## 2022-05-18 ENCOUNTER — Ambulatory Visit: Payer: Medicare Other | Admitting: Physician Assistant

## 2022-07-09 ENCOUNTER — Encounter: Payer: Self-pay | Admitting: Radiology

## 2022-07-18 ENCOUNTER — Other Ambulatory Visit: Payer: Self-pay | Admitting: Adult Health

## 2023-01-15 ENCOUNTER — Telehealth (INDEPENDENT_AMBULATORY_CARE_PROVIDER_SITE_OTHER): Payer: Medicare Other | Admitting: Adult Health

## 2023-01-15 DIAGNOSIS — G40909 Epilepsy, unspecified, not intractable, without status epilepticus: Secondary | ICD-10-CM

## 2023-01-15 DIAGNOSIS — G40309 Generalized idiopathic epilepsy and epileptic syndromes, not intractable, without status epilepticus: Secondary | ICD-10-CM

## 2023-01-15 NOTE — Progress Notes (Signed)
PATIENT: Diana Vega DOB: 04-Aug-1955  REASON FOR VISIT: follow up HISTORY FROM: patient  Virtual Visit via Video Note  I connected with Stonington Cellar on 01/15/23 at  9:15 AM EDT by a video enabled telemedicine application located remotely at [facility] and verified that I am speaking with the correct person using two identifiers who was located at their own home.   I discussed the limitations of evaluation and management by telemedicine and the availability of in person appointments. The patient expressed understanding and agreed to proceed.   PATIENT: Diana Vega DOB: April 23, 1956  REASON FOR VISIT: follow up HISTORY FROM: patient  HISTORY OF PRESENT ILLNESS: Today 01/15/23:  PITA MACFADYEN is a 67 y.o. female with a history of Seizures. Returns today for follow-up.  She remains on Dilantin, Depakote and Keppra.  Denies any seizure events.  No change in mood or behavior.  No change in gait or balance.  Able to complete all ADLs independently.  Continues to operate a motor vehicle.  Blood work was ordered at the last visit however she did not have this done.  Returns today for an evaluation.   01/20/22: Ms. Battistini is a 67 year old female with a history of seizures.  She returns today for virtual visit.  She remains on Dilantin Depakote and Keppra.  Denies any seizure events.  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  She returns today for an evaluation.  01/20/21:   Ms. Dabu is a 67 year old female with a history of seizures.  She returns today for follow-up.  She has not had any seizure events.  Remains on Dilantin, Depakote and Keppra.  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  Returns today for evaluation.   01/15/20: Ms. Marruffo is a 67 year old female with a history of seizures.  She returns today for follow-up.  Overall she feels that she is doing well.  She is not had any seizure  events.  Remains on Dilantin, Depakote and Keppra.  Denies any new symptoms.  Returns today for an evaluation.   HISTORY 01/11/19: Ms. Payseur is a 67 year old female with a history of seizures.  She returns today for follow-up.  She reports that she has been doing well.  She continues on Dilantin, Depakote and Keppra.  She denies any seizure events.  She denies any changes in her gait or balance.  No change in her mood or behavior.  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  She returns today for an evaluation.  REVIEW OF SYSTEMS: Out of a complete 14 system review of symptoms, the patient complains only of the following symptoms, and all other reviewed systems are negative.  ALLERGIES: Allergies  Allergen Reactions   Dilaudid [Hydromorphone Hcl]     acute renal failure    Codeine     REACTION: GI upsets   Egg-Derived Products    Hydrocodone-Acetaminophen     Percocet- REACTION: GI upsets   Toradol [Ketorolac Tromethamine]     ? Acute renal failure.   Zofran [Ondansetron Hcl] Nausea And Vomiting    HOME MEDICATIONS: Outpatient Medications Prior to Visit  Medication Sig Dispense Refill   divalproex (DEPAKOTE) 500 MG DR tablet TAKE 3 TABLETS BY MOUTH TWICE DAILY 540 tablet 3   ibuprofen (ADVIL,MOTRIN) 200 MG tablet Take 600 mg by mouth every 6 (six) hours as needed for headache or mild pain.     levETIRAcetam (KEPPRA)  500 MG tablet TAKE 1 TABLET BY MOUTH EVERY MORNING AND 2 AT BEDTIME 270 tablet 3   phenytoin (DILANTIN) 100 MG ER capsule TAKE 2 CAPSULES BY MOUTH TWICE DAILY 360 capsule 3   No facility-administered medications prior to visit.    PAST MEDICAL HISTORY: Past Medical History:  Diagnosis Date   Meningitis    Seizure (HCC)    seizure 11/10/17    PAST SURGICAL HISTORY: Past Surgical History:  Procedure Laterality Date   ABDOMINAL HYSTERECTOMY     APPENDECTOMY     CHOLECYSTECTOMY      FAMILY HISTORY: Family History  Problem Relation Age of Onset    Cancer Mother        colon   Cancer Father        Breast   Cancer Brother        Prostate   Kidney failure Brother    Hepatitis C Brother    Cancer Brother        Prostate    SOCIAL HISTORY: Social History   Socioeconomic History   Marital status: Married    Spouse name: Not on file   Number of children: 4   Years of education: College   Highest education level: Not on file  Occupational History   Occupation: Teacher, adult education: [place]  Tobacco Use   Smoking status: Every Day    Current packs/day: 0.50    Types: Cigarettes   Smokeless tobacco: Never  Vaping Use   Vaping status: Never Used  Substance and Sexual Activity   Alcohol use: No    Alcohol/week: 0.0 standard drinks of alcohol   Drug use: No   Sexual activity: Not on file  Other Topics Concern   Not on file  Social History Narrative   Patient lives at home with her husband.    Patient has 4 children.    Patient drinks 3 cups of caffeine daily.    Patient works as a Charity fundraiser for [employer].    Social Determinants of Health   Financial Resource Strain: Not on file  Food Insecurity: Not on file  Transportation Needs: Not on file  Physical Activity: Not on file  Stress: Not on file  Social Connections: Not on file  Intimate Partner Violence: Not on file      PHYSICAL EXAM Generalized: Well developed, in no acute distress   Neurological examination  Mentation: Alert oriented to time, place, history taking. Follows all commands speech and language fluent Cranial nerve II-XII:Extraocular movements were full. Facial symmetry noted.  Head turning and shoulder shrug  were normal and symmetric. Reflexes: UTA  DIAGNOSTIC DATA (LABS, IMAGING, TESTING) - I reviewed patient records, labs, notes, testing and imaging myself where available.  Lab Results  Component Value Date   WBC 8.7 01/20/2021   HGB 13.5 01/20/2021   HCT 39.3 01/20/2021   MCV 89 01/20/2021   PLT 267 01/20/2021      Component Value  Date/Time   NA 139 01/20/2021 1530   K 4.5 01/20/2021 1530   CL 102 01/20/2021 1530   CO2 24 01/20/2021 1530   GLUCOSE 81 01/20/2021 1530   GLUCOSE 110 (H) 11/11/2017 0512   BUN 12 01/20/2021 1530   CREATININE 0.56 (L) 01/20/2021 1530   CALCIUM 9.2 01/20/2021 1530   PROT 6.3 01/20/2021 1530   ALBUMIN 3.8 01/20/2021 1530   AST 15 01/20/2021 1530   ALT 9 01/20/2021 1530   ALKPHOS 98 01/20/2021 1530   BILITOT <0.2  01/20/2021 1530   GFRNONAA 101 01/15/2020 1521   GFRAA 117 01/15/2020 1521   Lab Results  Component Value Date   CHOL 238 (H) 02/07/2010   HDL 82.00 02/07/2010   LDLDIRECT 120.2 02/07/2010   TRIG 112.0 02/07/2010   CHOLHDL 3 02/07/2010    Lab Results  Component Value Date   TSH 0.84 02/07/2010      ASSESSMENT AND PLAN 67 y.o. year old female  has a past medical history of Meningitis and Seizure (HCC). here with :  1. Seizures   - Continue Dilantin 230 mg twice a day - Continue  Depakote 1500 mg twice a day - COntinue  Keppra 500 mg in the morning and at 1000 mg in the evening -Advised patient to come in at the beginning of the week for blood work.  She can stop in our office at any time. - Advised if she has any seizure event she should let us know - Follow-up in 1 year or sooner if needed      Butch Penny, MSN, NP-C 01/15/2023, 8:44 AM [facility] [address], [address] [place], [place] [address] [phone]

## 2023-02-19 ENCOUNTER — Encounter (HOSPITAL_COMMUNITY): Payer: Self-pay

## 2023-02-19 ENCOUNTER — Emergency Department (HOSPITAL_COMMUNITY): Payer: Medicare Other

## 2023-02-19 ENCOUNTER — Other Ambulatory Visit: Payer: Self-pay

## 2023-02-19 ENCOUNTER — Emergency Department (HOSPITAL_COMMUNITY)
Admission: EM | Admit: 2023-02-19 | Discharge: 2023-02-19 | Disposition: A | Discharge status: Home / Self Care | Payer: Medicare Other | Attending: Emergency Medicine | Admitting: Emergency Medicine

## 2023-02-19 DIAGNOSIS — S8991XA Unspecified injury of right lower leg, initial encounter: Secondary | ICD-10-CM | POA: Diagnosis present

## 2023-02-19 DIAGNOSIS — Z23 Encounter for immunization: Secondary | ICD-10-CM | POA: Diagnosis not present

## 2023-02-19 DIAGNOSIS — S82891A Other fracture of right lower leg, initial encounter for closed fracture: Secondary | ICD-10-CM | POA: Insufficient documentation

## 2023-02-19 DIAGNOSIS — F17210 Nicotine dependence, cigarettes, uncomplicated: Secondary | ICD-10-CM | POA: Diagnosis not present

## 2023-02-19 DIAGNOSIS — S8253XA Displaced fracture of medial malleolus of unspecified tibia, initial encounter for closed fracture: Secondary | ICD-10-CM | POA: Diagnosis not present

## 2023-02-19 DIAGNOSIS — S82831A Other fracture of upper and lower end of right fibula, initial encounter for closed fracture: Secondary | ICD-10-CM | POA: Diagnosis not present

## 2023-02-19 DIAGNOSIS — M25571 Pain in right ankle and joints of right foot: Secondary | ICD-10-CM | POA: Diagnosis not present

## 2023-02-19 DIAGNOSIS — R569 Unspecified convulsions: Secondary | ICD-10-CM

## 2023-02-19 DIAGNOSIS — G40909 Epilepsy, unspecified, not intractable, without status epilepticus: Secondary | ICD-10-CM | POA: Diagnosis not present

## 2023-02-19 DIAGNOSIS — W19XXXA Unspecified fall, initial encounter: Secondary | ICD-10-CM | POA: Diagnosis not present

## 2023-02-19 LAB — PHENYTOIN LEVEL, TOTAL: Phenytoin Lvl: <2.5 ug/mL — ABNORMAL LOW (ref 10.0–20.0)

## 2023-02-19 LAB — CBC WITH DIFFERENTIAL/PLATELET
Abs Immature Granulocytes: 0.07 10*3/uL (ref 0.00–0.07)
Basophils Absolute: 0.1 10*3/uL (ref 0.0–0.1)
Basophils Relative: 0 %
Eosinophils Absolute: 0.1 10*3/uL (ref 0.0–0.5)
Eosinophils Relative: 0 %
HCT: 43.5 % (ref 36.0–46.0)
Hemoglobin: 14.4 g/dL (ref 12.0–15.0)
Immature Granulocytes: 0 %
Lymphocytes Relative: 13 %
Lymphs Abs: 2.5 10*3/uL (ref 0.7–4.0)
MCH: 30.5 pg (ref 26.0–34.0)
MCHC: 33.1 g/dL (ref 30.0–36.0)
MCV: 92.2 fL (ref 80.0–100.0)
Monocytes Absolute: 1.1 10*3/uL — ABNORMAL HIGH (ref 0.1–1.0)
Monocytes Relative: 6 %
Neutro Abs: 15.1 10*3/uL — ABNORMAL HIGH (ref 1.7–7.7)
Neutrophils Relative %: 81 %
Platelets: 268 10*3/uL (ref 150–400)
RBC: 4.72 MIL/uL (ref 3.87–5.11)
RDW: 13.3 % (ref 11.5–15.5)
WBC: 18.9 10*3/uL — ABNORMAL HIGH (ref 4.0–10.5)
nRBC: 0 % (ref 0.0–0.2)

## 2023-02-19 LAB — BASIC METABOLIC PANEL
Anion gap: 15 (ref 5–15)
BUN: 9 mg/dL (ref 8–23)
CO2: 18 mmol/L — ABNORMAL LOW (ref 22–32)
Calcium: 9.1 mg/dL (ref 8.9–10.3)
Chloride: 103 mmol/L (ref 98–111)
Creatinine, Ser: 0.74 mg/dL (ref 0.44–1.00)
GFR, Estimated: >60 mL/min (ref >60)
Glucose, Bld: 161 mg/dL — ABNORMAL HIGH (ref 70–99)
Potassium: 4.5 mmol/L (ref 3.5–5.1)
Sodium: 136 mmol/L (ref 135–145)

## 2023-02-19 LAB — TROPONIN I (HIGH SENSITIVITY)
Troponin I (High Sensitivity): 13 ng/L (ref <18)
Troponin I (High Sensitivity): 24 ng/L — ABNORMAL HIGH (ref <18)

## 2023-02-19 LAB — VALPROIC ACID LEVEL: Valproic Acid Lvl: <10 ug/mL — ABNORMAL LOW (ref 50.0–100.0)

## 2023-02-19 MED ORDER — LEVETIRACETAM IN NACL 1500 MG/100ML IV SOLN
1500.0000 mg | Freq: Once | INTRAVENOUS | Status: AC
  Administered 2023-02-19: 1500 mg via INTRAVENOUS
  Filled 2023-02-19: qty 100

## 2023-02-19 MED ORDER — MORPHINE SULFATE (PF) 4 MG/ML IV SOLN
4.0000 mg | Freq: Once | INTRAVENOUS | Status: AC
  Administered 2023-02-19: 4 mg via INTRAVENOUS
  Filled 2023-02-19: qty 1

## 2023-02-19 MED ORDER — TETANUS-DIPHTH-ACELL PERTUSSIS 5-2.5-18.5 LF-MCG/0.5 IM SUSY
0.5000 mL | PREFILLED_SYRINGE | Freq: Once | INTRAMUSCULAR | Status: AC
  Administered 2023-02-19: 0.5 mL via INTRAMUSCULAR
  Filled 2023-02-19: qty 0.5

## 2023-02-19 MED ORDER — MORPHINE SULFATE (PF) 4 MG/ML IV SOLN
4.0000 mg | INTRAVENOUS | Status: DC | PRN
  Administered 2023-02-19 (×2): 4 mg via INTRAVENOUS
  Filled 2023-02-19 (×2): qty 1

## 2023-02-19 MED ORDER — SODIUM CHLORIDE 0.9 % IV BOLUS
1000.0000 mL | Freq: Once | INTRAVENOUS | Status: AC
  Administered 2023-02-19: 1000 mL via INTRAVENOUS

## 2023-02-19 MED ORDER — ETOMIDATE 2 MG/ML IV SOLN
10.0000 mg | Freq: Once | INTRAVENOUS | Status: AC
  Administered 2023-02-19: 10 mg via INTRAVENOUS
  Filled 2023-02-19: qty 10

## 2023-02-19 MED ORDER — CEFAZOLIN SODIUM-DEXTROSE 2-4 GM/100ML-% IV SOLN
2.0000 g | Freq: Once | INTRAVENOUS | Status: AC
  Administered 2023-02-19: 2 g via INTRAVENOUS
  Filled 2023-02-19: qty 100

## 2023-02-19 MED ORDER — ONDANSETRON HCL 4 MG/2ML IJ SOLN
4.0000 mg | Freq: Once | INTRAMUSCULAR | Status: AC
  Administered 2023-02-19: 4 mg via INTRAVENOUS
  Filled 2023-02-19: qty 2

## 2023-02-19 MED ORDER — MORPHINE SULFATE 15 MG PO TABS
15.0000 mg | ORAL_TABLET | Freq: Four times a day (QID) | ORAL | 0 refills | Status: DC | PRN
Start: 2023-02-19 — End: 2023-03-02

## 2023-02-19 NOTE — Discharge Instructions (Addendum)
Per [facility] statutes, patients with seizures are not allowed to drive until they have been seizure-free for six months.  Other recommendations include using caution when using heavy equipment or power tools. Avoid working on ladders or at heights. Take showers instead of baths.  Do not swim alone.  Ensure the water temperature is not too high on the home water heater. Do not go swimming alone. Do not lock yourself in a room alone (i.e. bathroom). When caring for infants or small children, sit down when holding, feeding, or changing them to minimize risk of injury to the child in the event you have a seizure. Maintain good sleep hygiene. Avoid alcohol.  Also recommend adequate sleep, hydration, good diet and minimize stress.    Take 2 Tylenol and 2 ibuprofen together every 6 hours for pain but if that does not work a prescription for morphine was sent to the [facility].  Make sure you are elevating your leg above your heart as often as possible.  Make sure you do not put any weight on your leg and use the walker.     During the Seizure   - First, ensure adequate ventilation and place patients on the floor on their left side  Loosen clothing around the neck and ensure the airway is patent. If the patient is clenching the teeth, do not force the mouth open with any object as this can cause severe damage - Remove all items from the surrounding that can be hazardous. The patient may be oblivious to what's happening and may not even know what he or she is doing. If the patient is confused and wandering, either gently guide him/her away and block access to outside areas - Reassure the individual and be comforting - Call 911. In most cases, the seizure ends before EMS arrives. However, there are cases when seizures may last over 3 to 5 minutes. Or the individual may have developed breathing difficulties or severe injuries. If a pregnant patient or a person with diabetes develops a seizure, it is prudent to  call an ambulance. - Finally, if the patient does not regain full consciousness, then call EMS. Most patients will remain confused for about 45 to 90 minutes after a seizure, so you must use judgment in calling for help. - Avoid restraints but make sure the patient is in a bed with padded side rails - Place the individual in a lateral position with the neck slightly flexed; this will help the saliva drain from the mouth and prevent the tongue from falling backward - Remove all nearby furniture and other hazards from the area - Provide verbal assurance as the individual is regaining consciousness - Provide the patient with privacy if possible - Call for help and start treatment as ordered by the caregiver   After the Seizure (Postictal Stage)   After a seizure, most patients experience confusion, fatigue, muscle pain and/or a headache. Thus, one should permit the individual to sleep. For the next few days, reassurance is essential. Being calm and helping reorient the person is also of importance.   Most seizures are painless and end spontaneously. Seizures are not harmful to others but can lead to complications such as stress on the lungs, brain and the heart. Individuals with prior lung problems may develop labored breathing and respiratory distress.

## 2023-02-19 NOTE — Consult Note (Signed)
ORTHOPAEDIC CONSULTATION  REQUESTING PHYSICIAN: Gwyneth Sprout, MD  Chief Complaint: Right ankle fracture dislocation   HPI: Diana Vega is a 67 y.o. female with  seizure disorder and fall resulting in Right ankle fracture dislocation.  Retired Engineer, civil (consulting).  No other areas of injury.    Past Medical History:  Diagnosis Date   Meningitis    Seizure (HCC)    seizure 11/10/17   Past Surgical History:  Procedure Laterality Date   ABDOMINAL HYSTERECTOMY     APPENDECTOMY     CHOLECYSTECTOMY     Social History   Socioeconomic History   Marital status: Married    Spouse name: Not on file   Number of children: 4   Years of education: College   Highest education level: Not on file  Occupational History   Occupation: Teacher, adult education:   Tobacco Use   Smoking status: Every Day    Current packs/day: 0.50    Types: Cigarettes   Smokeless tobacco: Never  Vaping Use   Vaping status: Never Used  Substance and Sexual Activity   Alcohol use: No    Alcohol/week: 0.0 standard drinks of alcohol   Drug use: No   Sexual activity: Not on file  Other Topics Concern   Not on file  Social History Narrative   Patient lives at home with her husband.    Patient has 4 children.    Patient drinks 3 cups of caffeine daily.    Patient works as a Charity fundraiser for [employer].    Social Determinants of Health   Financial Resource Strain: Not on file  Food Insecurity: Not on file  Transportation Needs: Not on file  Physical Activity: Not on file  Stress: Not on file  Social Connections: Not on file   Family History  Problem Relation Age of Onset   Cancer Mother        colon   Cancer Father        Breast   Cancer Brother        Prostate   Kidney failure Brother    Hepatitis C Brother    Cancer Brother        Prostate   Allergies  Allergen Reactions   Dilaudid [Hydromorphone Hcl]     acute renal failure    Codeine     REACTION: GI upsets   Egg-Derived Products     Hydrocodone-Acetaminophen     Percocet- REACTION: GI upsets   Toradol [Ketorolac Tromethamine]     ? Acute renal failure.   Zofran [Ondansetron Hcl] Nausea And Vomiting   Prior to Admission medications   Medication Sig Start Date End Date Taking? Authorizing Provider  aluminum-magnesium hydroxide 200-200 MG/5ML suspension Take 30 mLs by mouth every 6 (six) hours as needed for indigestion (Gastric issues.).   Yes [provider]  divalproex (DEPAKOTE) 500 MG DR tablet TAKE 3 TABLETS BY MOUTH TWICE DAILY 03/02/22  Yes Butch Penny, NP  ibuprofen (ADVIL,MOTRIN) 200 MG tablet Take 600 mg by mouth every 6 (six) hours as needed for headache or mild pain.   Yes [provider]  levETIRAcetam (KEPPRA) 500 MG tablet TAKE 1 TABLET BY MOUTH EVERY MORNING AND 2 AT BEDTIME 03/02/22  Yes [place], Aundra Millet, NP  phenytoin (DILANTIN) 100 MG ER capsule TAKE 2 CAPSULES BY MOUTH TWICE DAILY 07/22/22  Yes Butch Penny, NP   DG Ankle Complete Right  Result Date: 02/19/2023 CLINICAL DATA:  Right ankle fracture. Unwitnessed seizure  causing fall. EXAM: RIGHT ANKLE - COMPLETE 3+ VIEW COMPARISON:  None Available. FINDINGS: There is an acute fracture-dislocation of the ankle. There is complete lateral dislocation of the talus with respect to the distal tibia, the distal tibia laterally displaced approximately 3.7 cm. Acute fracture of the superior aspect of the medial malleolus with the distal aspect of the talus still positioned at the medial aspect of the talus. Oblique fracture of the distal fibular diaphysis with the distal fibula normally positioned at the lateral aspect of the talus but they proximal fibular fracture fragment displaced medially with the tibia, resting on the superomedial aspect of the talar dome. There is also approximately 10 mm posterior subluxation of the distal talus with respect to the IMPRESSION: Acute fracture-dislocation of the ankle as above. Electronically Signed   By:  Neita Garnet M.D.   On: 02/19/2023 15:52   DG Chest Portable 1 View  Result Date: 02/19/2023 CLINICAL DATA:  Chest pain EXAM: PORTABLE CHEST 1 VIEW COMPARISON:  11/11/2017 FINDINGS: No focal opacity or pleural effusion. Normal cardiac size. No pneumothorax. IMPRESSION: No active disease. Electronically Signed   By: Jasmine Pang M.D.   On: 02/19/2023 15:44   Family History Reviewed and non-contributory, no pertinent history of problems with bleeding or anesthesia      Review of Systems 14 system ROS conducted and negative except for that noted in HPI   OBJECTIVE  Vitals:Patient Vitals for the past 8 hrs:  BP Pulse Resp SpO2 Height Weight  02/19/23 1620 (!) 118/56 85 14 100 % -- --  02/19/23 1600 (!) 142/76 93 18 100 % -- --  02/19/23 1557 (!) 143/77 -- -- -- -- --  02/19/23 1551 -- 92 16 100 % -- --  02/19/23 1545 -- 92 18 100 % -- --  02/19/23 1349 118/66 90 14 98 % -- --  02/19/23 1347 -- -- -- -- 5\' 2"  (1.575 m) 86.2 kg   General: Alert, no acute distress Cardiovascular: Warm extremities noted Respiratory: No cyanosis, no use of accessory musculature GI: No organomegaly, abdomen is soft and non-tender Skin: No lesions in the area of chief complaint other than those listed below in MSK exam.  Neurologic: Sensation intact distally save for the below mentioned MSK exam Psychiatric: Patient is competent for consent with normal mood and affect Lymphatic: No swelling obvious and reported other than the area involved in the exam below Extremities  RLE: NVID, medial skin trauma but no open wound.  Obvious deformity, good pulses, wiggles toes but other motor testing was unable to be performed.    Test Results Imaging Right ankle fracture dislocation noted on 3 v of the ankle  Labs cbc Recent Labs    02/19/23 1407  WBC 18.9*  HGB 14.4  HCT 43.5  PLT 268    Labs inflam No results for input(s): "CRP" in the last 72 hours.  Invalid input(s): "ESR"  Labs coag No  results for input(s): "INR", "PTT" in the last 72 hours.  Invalid input(s): "PT"  Recent Labs    02/19/23 1407  NA 136  K 4.5  CL 103  CO2 18*  GLUCOSE 161*  BUN 9  CREATININE 0.74  CALCIUM 9.1     ASSESSMENT AND PLAN: 67 y.o. female with the following: RLE ankle fracture dislocation  Ankle fracture requires reduction and surgery.  Due to skin trauma after discussing with my foot and ankle colleagues would benefit from time to allow skin to calm before surgery.  Risks  and benefits of closed reduction were discussed.  Appropriate reduction noted, follow up with Dr. Susa Simmonds Monday.    Timeout called and patient and extremity correctly identified.    The emergency room staff provided procedural sedation prior to proceeding with the reduction portion of the procedure   A gentle closed reduction was performed.  We used fluoroscopic imaging to guide our reduction.  A near anatomic reduction was obtained.  A well molded, well padded short leg splint was placed.  The patient was awoken from sedation without complication.   -Procedures: Closed reduction of ankle fracture dislocation

## 2023-02-19 NOTE — ED Triage Notes (Addendum)
Pt arrived via [facility] from home. Pt had an unwitnessed "seizure" that caused her to fall. Pt states she believes she had a seizure, because of how she felt and that is typical of when she has a seizure. Per EMS, pt has deformity of right ankle. Pt hit her head, but denies blood thinners. EMS gave fentanyl 200 mcg IV. Per EMS< pt states she has been non-compliant with seizure meds.

## 2023-02-19 NOTE — ED Provider Notes (Signed)
Assumed care from Dr. Wallace Cullens at 4 PM.  Patient with a complex both bone ankle fracture dislocation.  Dr. Everardo Pacific with orthopedics came and reduced the patient under sedation.  She tolerated the procedure well.  Repeat imaging showed improved alignment.  Patient's fall was related to seizure today most likely from missing doses of her medication.  I independently interpreted patient's labs and CBC with leukocytosis of 18 with normal hemoglobin most likely acute phase reaction related to the seizure and the fracture, BMP within normal limits today, phenytoin and Depakote levels are undetectable.  Troponin within normal limits.  Spoke with Dr. Everardo Pacific and he states the patient is clear to follow-up with Dr. Susa Simmonds on Monday.  This was discussed with the patient and her husband.  She would like to try Tylenol and ibuprofen at home but she was given a prescription for morphine to use as needed.  She also has a walker at home and discussed with her she should not bear any weight on that foot and elevate it as often as she can.  She has prescriptions for her seizure medication at home as she just recently got them refilled.   Gwyneth Sprout, MD 02/19/23 [phone]

## 2023-02-19 NOTE — Progress Notes (Signed)
Orthopedic Tech Progress Note Patient Details:  Diana Vega 1956-02-07 [id number]  Well-padded posterior short leg splint with stirrup applied to RLE following reduction by Dr. Everardo Pacific.   Ortho Devices Type of Ortho Device: Stirrup splint, Post (short leg) splint Ortho Device/Splint Location: RLE Ortho Device/Splint Interventions: Ordered, Application, Adjustment   Post Interventions Patient Tolerated: Well Instructions Provided: Care of device  [name] [name] 02/19/2023, 4:21 PM

## 2023-02-19 NOTE — Progress Notes (Signed)
Reviewed post reduction xrays, appropriate for discharge and follow up momnday with Dr. Susa Simmonds who is aware of the patient.

## 2023-02-19 NOTE — ED Provider Notes (Signed)
[place] EMERGENCY DEPARTMENT AT [facility] Provider Note  CSN: [id number] Arrival date & time: 02/19/23 1342  Chief Complaint(s) Seizures and Fall  HPI Diana Vega is a 67 y.o. female with past medical history as below, significant for seizure, epilepsy (last sz prior to today 2020 per pt), gerd who presents to the ED with complaint of seizure/ankle injury  Patient was reported to have had a seizure around 12:00 this afternoon.  She has missed few doses of her medications.  Diana Vega and hurt her right ankle.  Pain to right ankle.  Difficulty moving her foot secondary to severe pain.  Felt nauseated after seizure which is typical for her postictal periods and was given Zofran by EMS.  Also given fentanyl 200 mcg by EMS.  She has not chest pain which has since improved. No dib. No abd pain. 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 Authorizing Provider  aluminum-magnesium hydroxide 200-200 MG/5ML suspension Take 30 mLs by mouth every 6 (six) hours as needed for indigestion (Gastric issues.).   Yes [provider]  divalproex (DEPAKOTE) 500 MG DR tablet TAKE 3 TABLETS BY MOUTH TWICE DAILY 03/02/22  Yes Butch Penny, NP  ibuprofen (ADVIL,MOTRIN) 200 MG tablet Take 600 mg by mouth every 6 (six) hours as needed for headache or mild pain.   Yes [provider]  levETIRAcetam (KEPPRA) 500 MG tablet TAKE 1 TABLET BY  MOUTH EVERY MORNING AND 2 AT BEDTIME 03/02/22  Yes Millikan, Aundra Millet, NP  phenytoin (DILANTIN) 100 MG ER capsule TAKE 2 CAPSULES BY MOUTH TWICE DAILY 07/22/22  Yes Butch Penny, NP                                                                                                                                    Past Surgical History Past Surgical History:  Procedure Laterality Date   ABDOMINAL HYSTERECTOMY     APPENDECTOMY     CHOLECYSTECTOMY     Family History Family History  Problem Relation Age of Onset   Cancer Mother        colon   Cancer Father        Breast   Cancer Brother        Prostate   Kidney failure Brother    Hepatitis C Brother    Cancer Brother  Prostate    Social History Social History   Tobacco Use   Smoking status: Every Day    Current packs/day: 0.50    Types: Cigarettes   Smokeless tobacco: Never  Vaping Use   Vaping status: Never Used  Substance Use Topics   Alcohol use: No    Alcohol/week: 0.0 standard drinks of alcohol   Drug use: No   Allergies Dilaudid [hydromorphone hcl], Codeine, Egg-derived products, Hydrocodone-acetaminophen, Toradol [ketorolac tromethamine], and Zofran [ondansetron hcl]  Review of Systems Review of Systems  Constitutional:  Negative for chills and fever.  Respiratory:  Negative for chest tightness and shortness of breath.   Cardiovascular:  Positive for chest pain. Negative for palpitations.  Gastrointestinal:  Positive for nausea and vomiting. Negative for abdominal pain.  Musculoskeletal:  Positive for arthralgias and back pain.  Neurological:  Positive for seizures.  All other systems reviewed and are negative.   Physical Exam Vital Signs  I have reviewed the triage vital signs BP (!) 118/56   Pulse 85   Resp 14   Ht 5\' 2"  (1.575 m)   Wt 86.2 kg   SpO2 100%   BMI 34.76 kg/m  Physical Exam Vitals and nursing note reviewed.  Constitutional:      General: She is not in acute distress.     Appearance: Normal appearance. She is not ill-appearing.  HENT:     Head: Normocephalic and atraumatic.     Right Ear: External ear normal.     Left Ear: External ear normal.     Nose: Nose normal.     Mouth/Throat:     Mouth: Mucous membranes are moist.  Eyes:     General: No scleral icterus.       Right eye: No discharge.        Left eye: No discharge.  Cardiovascular:     Rate and Rhythm: Normal rate and regular rhythm.     Pulses: Normal pulses.     Heart sounds: Normal heart sounds.  Pulmonary:     Effort: Pulmonary effort is normal. No respiratory distress.     Breath sounds: Normal breath sounds. No stridor.  Abdominal:     General: Abdomen is flat. There is no distension.     Palpations: Abdomen is soft.     Tenderness: There is no abdominal tenderness.  Musculoskeletal:     Cervical back: No rigidity.     Right lower leg: No edema.     Left lower leg: No edema.       Legs:     Comments: Obvious deformity right ankle, wound noted medial malleolus.  Palpable DP pulse bilateral.  Cap refill is brisk to toes bilateral.  Sensation intact bilateral feet  Skin:    General: Skin is warm and dry.     Capillary Refill: Capillary refill takes less than 2 seconds.  Neurological:     Mental Status: She is alert.     GCS: GCS eye subscore is 4. GCS verbal subscore is 5. GCS motor subscore is 6.     Cranial Nerves: Cranial nerves 2-12 are intact.     Sensory: Sensation is intact.     Motor: Motor function is intact.     Coordination: Coordination is intact.     Comments: Unable to eval RLE strength 2/2 deformity   Gait testing deferred secondary to patient safety.   Psychiatric:        Mood and Affect: Mood normal.  Behavior: Behavior normal. Behavior is cooperative.     ED Results and Treatments Labs (all labs ordered are listed, but only abnormal results are displayed) Labs Reviewed  CBC WITH DIFFERENTIAL/PLATELET - Abnormal; Notable for the following  components:      Result Value   WBC 18.9 (*)    Neutro Abs 15.1 (*)    Monocytes Absolute 1.1 (*)    All other components within normal limits  BASIC METABOLIC PANEL - Abnormal; Notable for the following components:   CO2 18 (*)    Glucose, Bld 161 (*)    All other components within normal limits  PHENYTOIN LEVEL, TOTAL - Abnormal; Notable for the following components:   Phenytoin Lvl <2.5 (*)    All other components within normal limits  VALPROIC ACID LEVEL - Abnormal; Notable for the following components:   Valproic Acid Lvl <10 (*)    All other components within normal limits  LEVETIRACETAM LEVEL  TROPONIN I (HIGH SENSITIVITY)  TROPONIN I (HIGH SENSITIVITY)                                                                                                                          Radiology DG Ankle Complete Right  Result Date: 02/19/2023 CLINICAL DATA:  Right ankle fracture. Unwitnessed seizure causing fall. EXAM: RIGHT ANKLE - COMPLETE 3+ VIEW COMPARISON:  None Available. FINDINGS: There is an acute fracture-dislocation of the ankle. There is complete lateral dislocation of the talus with respect to the distal tibia, the distal tibia laterally displaced approximately 3.7 cm. Acute fracture of the superior aspect of the medial malleolus with the distal aspect of the talus still positioned at the medial aspect of the talus. Oblique fracture of the distal fibular diaphysis with the distal fibula normally positioned at the lateral aspect of the talus but they proximal fibular fracture fragment displaced medially with the tibia, resting on the superomedial aspect of the talar dome. There is also approximately 10 mm posterior subluxation of the distal talus with respect to the IMPRESSION: Acute fracture-dislocation of the ankle as above. Electronically Signed   By: Neita Garnet M.D.   On: 02/19/2023 15:52   DG Chest Portable 1 View  Result Date: 02/19/2023 CLINICAL DATA:  Chest pain EXAM:  PORTABLE CHEST 1 VIEW COMPARISON:  11/11/2017 FINDINGS: No focal opacity or pleural effusion. Normal cardiac size. No pneumothorax. IMPRESSION: No active disease. Electronically Signed   By: Jasmine Pang M.D.   On: 02/19/2023 15:44    Pertinent labs & imaging results that were available during my care of the patient were reviewed by me and considered in my medical decision making (see MDM for details).  Medications Ordered in ED Medications  morphine (PF) 4 MG/ML injection 4 mg (4 mg Intravenous Given 02/19/23 1532)  ceFAZolin (ANCEF) IVPB 2g/100 mL premix (0 g Intravenous Stopped 02/19/23 1535)  sodium chloride 0.9 % bolus 1,000 mL (1,000 mLs Intravenous New Bag/Given 02/19/23 1409)  morphine (PF) 4 MG/ML injection  4 mg (4 mg Intravenous Given 02/19/23 1410)  ondansetron (ZOFRAN) injection 4 mg (4 mg Intravenous Given 02/19/23 1410)  levETIRAcetam (KEPPRA) IVPB 1500 mg/ 100 mL premix (0 mg Intravenous Stopped 02/19/23 1522)  Tdap (BOOSTRIX) injection 0.5 mL (0.5 mLs Intramuscular Given 02/19/23 1451)  etomidate (AMIDATE) injection 10 mg (10 mg Intravenous Given 02/19/23 1556)                                                                                                                                     Procedures Procedures  (including critical care time)  Medical Decision Making / ED Course    Medical Decision Making:    IMAAN RIPPER is a 67 y.o. female with past medical history as below, significant for seizure, epilepsy (last sz prior to today 2020 per pt), gerd who presents to the ED with complaint of seizure/ankle injury. The complaint involves an extensive differential diagnosis and also carries with it a high risk of complications and morbidity.  Serious etiology was considered. Ddx includes but is not limited to: Fracture, dislocation, sprain, strain, seizure, breakthrough seizure, induced seizure, etc.  Complete initial physical exam performed, notably the patient   was neuro status is at baseline, pain to RLE noted.    Reviewed and confirmed nursing documentation for past medical history, family history, social history.  Vital signs reviewed.    Clinical Course as of 02/19/23 1709  Fri Feb 19, 2023  1440 Obvious fracture on XR, LE NVI, will d/w ortho [SG]    Clinical Course User Index [SG] Tanda Rockers A, DO     Obvious deformity right lower extremity.  Splinted by EMS.  Get x-ray.  LE NVI.  She is having some chest pain.  Obtain cardiac workup.  Nausea has resolved following Zofran by EMS.  Neuroexam is stable. CP mostly resolved at time of arrival.   Spoke with Dr Everardo Pacific ortho, will come to eval  Load w/ keppra, seizure precautions ordered  Pt signed out to incoming EDP pending sedation/reduction, remainder of labs.                  Additional history obtained: -Additional history obtained from EMS -External records from outside source obtained and reviewed including: Chart review including previous notes, labs, imaging, consultation notes including  Home meds Prior ed visit (left ft fx)   Lab Tests: -I ordered, reviewed, and interpreted labs.   The pertinent results include:   Labs Reviewed  CBC WITH DIFFERENTIAL/PLATELET - Abnormal; Notable for the following components:      Result Value   WBC 18.9 (*)    Neutro Abs 15.1 (*)    Monocytes Absolute 1.1 (*)    All other components within normal limits  BASIC METABOLIC PANEL - Abnormal; Notable for the following components:   CO2 18 (*)    Glucose, Bld 161 (*)    All other components within normal  limits  PHENYTOIN LEVEL, TOTAL - Abnormal; Notable for the following components:   Phenytoin Lvl <2.5 (*)    All other components within normal limits  VALPROIC ACID LEVEL - Abnormal; Notable for the following components:   Valproic Acid Lvl <10 (*)    All other components within normal limits  LEVETIRACETAM LEVEL  TROPONIN I (HIGH SENSITIVITY)  TROPONIN I (HIGH  SENSITIVITY)    Notable for WBC + likely 2/2 seizure/trauma  EKG   EKG Interpretation Date/Time:    Ventricular Rate:    PR Interval:    QRS Duration:    QT Interval:    QTC Calculation:   R Axis:      Text Interpretation:           Imaging Studies ordered: I ordered imaging studies including ankle xr, cxr I independently visualized the following imaging with scope of interpretation limited to determining acute life threatening conditions related to emergency care; findings noted above I independently visualized and interpreted imaging. I agree with the radiologist interpretation   Medicines ordered and prescription drug management: Meds ordered this encounter  Medications   ceFAZolin (ANCEF) IVPB 2g/100 mL premix    Order Specific Question:   Antibiotic Indication:    Answer:   Surgical Prophylaxis   sodium chloride 0.9 % bolus 1,000 mL   morphine (PF) 4 MG/ML injection 4 mg   ondansetron (ZOFRAN) injection 4 mg   levETIRAcetam (KEPPRA) IVPB 1500 mg/ 100 mL premix   Tdap (BOOSTRIX) injection 0.5 mL   etomidate (AMIDATE) injection 10 mg   morphine (PF) 4 MG/ML injection 4 mg    -I have reviewed the patients home medicines and have made adjustments as needed   Consultations Obtained: I requested consultation with the Dr Everardo Pacific,  and discussed lab and imaging findings as well as pertinent plan - they recommend: reduce   Cardiac Monitoring: The patient was maintained on a cardiac monitor.  I personally viewed and interpreted the cardiac monitored which showed an underlying rhythm of: NSR  Social Determinants of Health:  Diagnosis or treatment significantly limited by social determinants of health: current smoker    Reevaluation: After the interventions noted above, I reevaluated the patient and found that they have improved  Co morbidities that complicate the patient evaluation  Past Medical History:  Diagnosis Date   Meningitis    Seizure (HCC)     seizure 11/10/17      Dispostion: Disposition decision including need for hospitalization was considered, and patient disposition pending at time of sign out.    Final Clinical Impression(s) / ED Diagnoses Final diagnoses:  Closed fracture of right ankle, initial encounter  Seizure (HCC)        Sloan Leiter, DO 02/19/23 1709

## 2023-02-24 ENCOUNTER — Other Ambulatory Visit: Payer: Self-pay | Admitting: Orthopaedic Surgery

## 2023-02-26 NOTE — Progress Notes (Addendum)
COVID Vaccine received:  []  No [x]  Yes Date of any COVID positive Test in last 90 days: no PCP -Doesn't have one right now. Her old one retired. Cardiologist -   Chest x-ray - 02/19/23 Epic EKG -   Stress Test -  ECHO -  Cardiac Cath -   Bowel Prep - [x]  No  []   Yes ______  Pacemaker / ICD device [x]  No []  Yes   Spinal Cord Stimulator:[x]  No []  Yes       History of Sleep Apnea? [x]  No []  Yes   CPAP used?- [x]  No []  Yes    Does the patient monitor blood sugar?          [x]  No []  Yes  []  N/A  Patient has: [x]  NO Hx DM   []  Pre-DM                 []  DM1  []   DM2 Does patient have a [employer] or Dexacom? []  No []  Yes   Fasting Blood Sugar Ranges-  Checks Blood Sugar _____ times a day  GLP1 agonist / usual dose - no GLP1 instructions:  SGLT-2 inhibitors / usual dose - no SGLT-2 instructions:   Blood Thinner / Instructions:no Aspirin Instructions:no  Comments:   Activity level: Patient is unable to climb a flight of stairs without difficulty; [x]  No CP  [x]  No SOB, but would have __ankle pain_   Patient can perform ADLs without assistance.   Anesthesia review:   Patient denies shortness of breath, fever, cough and chest pain at PAT appointment.  Patient verbalized understanding and agreement to the Pre-Surgical Instructions that were given to them at this PAT appointment. Patient was also educated of the need to review these PAT instructions again prior to his/her surgery.I reviewed the appropriate phone numbers to call if they have any and questions or concerns.

## 2023-02-26 NOTE — Patient Instructions (Addendum)
SURGICAL WAITING ROOM VISITATION  Patients having surgery or a procedure may have no more than 2 support people in the waiting area - these visitors may rotate.    Children under the age of 35 must have an adult with them who is not the patient.  Due to an increase in RSV and influenza rates and associated hospitalizations, children ages 79 and under may not visit patients in [facility] hospitals.  If the patient needs to stay at the hospital during part of their recovery, the visitor guidelines for inpatient rooms apply. Pre-op nurse will coordinate an appropriate time for 1 support person to accompany patient in pre-op.  This support person may not rotate.    Please refer to the [facility] website for the visitor guidelines for Inpatients (after your surgery is over and you are in a regular room).       Your procedure is scheduled on: 03/02/23   Report to [facility] Main Entrance    Report to admitting at  12:45 PM   Call this number if you have problems the morning of surgery [phone]   Do not eat food :After Midnight.   After Midnight you may have the following liquids until 12 noon DAY OF SURGERY  Water Non-Citrus Juices (without pulp, NO RED-Apple, White grape, White cranberry) Black Coffee (NO MILK/CREAM OR CREAMERS, sugar ok)  Clear Tea (NO MILK/CREAM OR CREAMERS, sugar ok) regular and decaf                             Plain Jell-O (NO RED)                                           Fruit ices (not with fruit pulp, NO RED)                                     Popsicles (NO RED)                                                               Sports drinks like Gatorade (NO RED)                The day of surgery:  Drink ONE (1) Pre-Surgery Clear Ensure at 12noon the morning of surgery. Drink in one sitting. Do not sip.  This drink was given to you during your hospital  pre-op appointment visit. Nothing else to drink after completing the  Pre-Surgery Clear  Ensure    Oral Hygiene is also important to reduce your risk of infection.                                    Remember - BRUSH YOUR TEETH THE MORNING OF SURGERY WITH YOUR REGULAR TOOTHPASTE    Stop all vitamins and herbal supplements 7 days before surgery.   Take these medicines the morning of surgery with A SIP OF WATER:  Depakote, Keppra, Dilantin, Morphone OR Oxycodone if needed.  DO NOT TAKE ANY ORAL DIABETIC MEDICATIONS DAY OF YOUR SURGERY  Bring CPAP mask and tubing day of surgery.                              You may not have any metal on your body including hair pins, jewelry, and body piercing             Do not wear make-up, lotions, powders, perfumes/cologne, or deodorant  Do not wear nail polish including gel and S&S, artificial/acrylic nails, or any other type of covering on natural nails including finger and toenails. If you have artificial nails, gel coating, etc. that needs to be removed by a nail salon please have this removed prior to surgery or surgery may need to be canceled/ delayed if the surgeon/ anesthesia feels like they are unable to be safely monitored.   Do not shave  48 hours prior to surgery.               Men may shave face and neck.   Do not bring valuables to the hospital. [facility] IS NOT             RESPONSIBLE   FOR VALUABLES.   Contacts, glasses, dentures or bridgework may not be worn into surgery.   Bring small overnight bag day of surgery.   DO NOT BRING YOUR HOME MEDICATIONS TO THE HOSPITAL. PHARMACY WILL DISPENSE MEDICATIONS LISTED ON YOUR MEDICATION LIST TO YOU DURING YOUR ADMISSION IN THE HOSPITAL!    Patients discharged on the day of surgery will not be allowed to drive home.  Someone NEEDS to stay with you for the first 24 hours after anesthesia.   Special Instructions: Bring a copy of your healthcare power of attorney and living will documents the day of surgery if you haven't scanned them before.              Please read over the  following fact sheets you were given: IF YOU HAVE QUESTIONS ABOUT YOUR PRE-OP INSTRUCTIONS PLEASE CALL [phone] Rosey Bath   If you received a COVID test during your pre-op visit  it is requested that you wear a mask when out in public, stay away from anyone that may not be feeling well and notify your surgeon if you develop symptoms. If you test positive for Covid or have been in contact with anyone that has tested positive in the last 10 days please notify you surgeon.    [facility] - Preparing for Surgery Before surgery, you can play an important role.  Because skin is not sterile, your skin needs to be as free of germs as possible.  You can reduce the number of germs on your skin by washing with CHG (chlorahexidine gluconate) soap before surgery.  CHG is an antiseptic cleaner which kills germs and bonds with the skin to continue killing germs even after washing. Please DO NOT use if you have an allergy to CHG or antibacterial soaps.  If your skin becomes reddened/irritated stop using the CHG and inform your nurse when you arrive at Short Stay. Do not shave (including legs and underarms) for at least 48 hours prior to the first CHG shower.  You may shave your face/neck.  Please follow these instructions carefully:  1.  Shower with CHG Soap the night before surgery and the  morning of surgery.  2.  If you choose to wash your hair, wash your hair first  as usual with your normal  shampoo.  3.  After you shampoo, rinse your hair and body thoroughly to remove the shampoo.                             4.  Use CHG as you would any other liquid soap.  You can apply chg directly to the skin and wash.  Gently with a scrungie or clean washcloth.  5.  Apply the CHG Soap to your body ONLY FROM THE NECK DOWN.   Do   not use on face/ open                           Wound or open sores. Avoid contact with eyes, ears mouth and   genitals (private parts).                       Wash face,  Genitals (private parts)  with your normal soap.             6.  Wash thoroughly, paying special attention to the area where your    surgery  will be performed.  7.  Thoroughly rinse your body with warm water from the neck down.  8.  DO NOT shower/wash with your normal soap after using and rinsing off the CHG Soap.                9.  Pat yourself dry with a clean towel.            10.  Wear clean pajamas.            11.  Place clean sheets on your bed the night of your first shower and do not  sleep with pets. Day of Surgery : Do not apply any lotions/deodorants the morning of surgery.  Please wear clean clothes to the hospital/surgery center.  FAILURE TO FOLLOW THESE INSTRUCTIONS MAY RESULT IN THE CANCELLATION OF YOUR SURGERY  PATIENT SIGNATURE_________________________________  NURSE SIGNATURE__________________________________  ________________________________________________________________________Incentive Gypsy [employer]ant (Watch this video at home: [email or web address])  An incentive spirometer is a tool that can help keep your lungs clear and active. This tool measures how well you are filling your lungs with each breath. Taking long deep breaths may help reverse or decrease the chance of developing breathing (pulmonary) problems (especially infection) following: A long period of time when you are unable to move or be active. BEFORE THE PROCEDURE  If the spirometer includes an indicator to show your best effort, your nurse or respiratory therapist will set it to a desired goal. If possible, sit up straight or lean slightly forward. Try not to slouch. Hold the incentive spirometer in an upright position. INSTRUCTIONS FOR USE  Sit on the edge of your bed if possible, or sit up as far as you can in bed or on a chair. Hold the incentive spirometer in an upright position. Breathe out normally. Place the mouthpiece in your mouth and seal your lips tightly around it. Breathe in slowly and as  deeply as possible, raising the piston or the ball toward the top of the column. Hold your breath for 3-5 seconds or for as long as possible. Allow the piston or ball to fall to the bottom of the column. Remove the mouthpiece from your mouth and breathe out normally. Rest for a few seconds and repeat Steps 1 through 7 at least 10 times  every 1-2 hours when you are awake. Take your time and take a few normal breaths between deep breaths. The spirometer may include an indicator to show your best effort. Use the indicator as a goal to work toward during each repetition. After each set of 10 deep breaths, practice coughing to be sure your lungs are clear. If you have an incision (the cut made at the time of surgery), support your incision when coughing by placing a pillow or rolled up towels firmly against it. Once you are able to get out of bed, walk around indoors and cough well. You may stop using the incentive spirometer when instructed by your caregiver.  RISKS AND COMPLICATIONS Take your time so you do not get dizzy or light-headed. If you are in pain, you may need to take or ask for pain medication before doing incentive spirometry. It is harder to take a deep breath if you are having pain. AFTER USE Rest and breathe slowly and easily. It can be helpful to keep track of a log of your progress. Your caregiver can provide you with a simple table to help with this. If you are using the spirometer at home, follow these instructions: SEEK MEDICAL CARE IF:  You are having difficultly using the spirometer. You have trouble using the spirometer as often as instructed. Your pain medication is not giving enough relief while using the spirometer. You develop fever of 100.5 F (38.1 C) or higher. SEEK IMMEDIATE MEDICAL CARE IF:  You cough up bloody sputum that had not been present before. You develop fever of 102 F (38.9 C) or greater. You develop worsening pain at or near the incision site. MAKE  SURE YOU:  Understand these instructions. Will watch your condition. Will get help right away if you are not doing well or get worse. Document Released: 08/31/2006 Document Revised: 07/13/2011 Document Reviewed: 11/01/2006 [facility] Patient Information 2014 [place], [place].

## 2023-03-01 ENCOUNTER — Encounter (HOSPITAL_COMMUNITY): Payer: Self-pay

## 2023-03-01 ENCOUNTER — Inpatient Hospital Stay (HOSPITAL_COMMUNITY)
Admission: RE | Admit: 2023-03-01 | Discharge: 2023-03-01 | Disposition: A | Discharge status: Home / Self Care | Payer: Medicare Other | Source: Ambulatory Visit | Attending: Orthopaedic Surgery

## 2023-03-01 ENCOUNTER — Other Ambulatory Visit: Payer: Self-pay

## 2023-03-01 VITALS — BP 141/81 | HR 81 | Resp 16 | Ht 62.0 in | Wt 200.0 lb

## 2023-03-01 DIAGNOSIS — Z01812 Encounter for preprocedural laboratory examination: Secondary | ICD-10-CM | POA: Diagnosis present

## 2023-03-01 DIAGNOSIS — Z5181 Encounter for therapeutic drug level monitoring: Secondary | ICD-10-CM | POA: Diagnosis not present

## 2023-03-01 DIAGNOSIS — Z01818 Encounter for other preprocedural examination: Secondary | ICD-10-CM

## 2023-03-01 HISTORY — DX: Gastro-esophageal reflux disease without esophagitis: K21.9

## 2023-03-01 HISTORY — DX: Myoneural disorder, unspecified: G70.9

## 2023-03-01 LAB — CBC
HCT: 40.3 % (ref 36.0–46.0)
Hemoglobin: 13.3 g/dL (ref 12.0–15.0)
MCH: 31.4 pg (ref 26.0–34.0)
MCHC: 33 g/dL (ref 30.0–36.0)
MCV: 95.3 fL (ref 80.0–100.0)
Platelets: 378 10*3/uL (ref 150–400)
RBC: 4.23 MIL/uL (ref 3.87–5.11)
RDW: 14.1 % (ref 11.5–15.5)
WBC: 15.4 10*3/uL — ABNORMAL HIGH (ref 4.0–10.5)
nRBC: 0 % (ref 0.0–0.2)

## 2023-03-01 NOTE — Progress Notes (Signed)
Please review pt's preop CBC from 03/01/23.

## 2023-03-02 ENCOUNTER — Ambulatory Visit (HOSPITAL_COMMUNITY): Payer: Medicare Other

## 2023-03-02 ENCOUNTER — Encounter (HOSPITAL_COMMUNITY): Payer: Self-pay | Admitting: Orthopaedic Surgery

## 2023-03-02 ENCOUNTER — Ambulatory Visit (HOSPITAL_COMMUNITY): Payer: Self-pay | Admitting: Physician Assistant

## 2023-03-02 ENCOUNTER — Other Ambulatory Visit: Payer: Self-pay

## 2023-03-02 ENCOUNTER — Ambulatory Visit (HOSPITAL_COMMUNITY): Payer: Medicare Other | Admitting: Certified Registered"

## 2023-03-02 ENCOUNTER — Ambulatory Visit (HOSPITAL_COMMUNITY)
Admission: RE | Admit: 2023-03-02 | Discharge: 2023-03-02 | Disposition: A | Discharge status: Home / Self Care | Payer: Medicare Other | Attending: Orthopaedic Surgery | Admitting: Orthopaedic Surgery

## 2023-03-02 ENCOUNTER — Encounter (HOSPITAL_COMMUNITY)
Admission: RE | Disposition: A | Discharge status: Home / Self Care | Payer: Self-pay | Source: Home / Self Care | Attending: Orthopaedic Surgery

## 2023-03-02 DIAGNOSIS — K219 Gastro-esophageal reflux disease without esophagitis: Secondary | ICD-10-CM | POA: Insufficient documentation

## 2023-03-02 DIAGNOSIS — F17210 Nicotine dependence, cigarettes, uncomplicated: Secondary | ICD-10-CM | POA: Insufficient documentation

## 2023-03-02 DIAGNOSIS — E669 Obesity, unspecified: Secondary | ICD-10-CM | POA: Insufficient documentation

## 2023-03-02 DIAGNOSIS — S93431A Sprain of tibiofibular ligament of right ankle, initial encounter: Secondary | ICD-10-CM | POA: Diagnosis not present

## 2023-03-02 DIAGNOSIS — R569 Unspecified convulsions: Secondary | ICD-10-CM

## 2023-03-02 DIAGNOSIS — Z01818 Encounter for other preprocedural examination: Secondary | ICD-10-CM

## 2023-03-02 DIAGNOSIS — Z6836 Body mass index (BMI) 36.0-36.9, adult: Secondary | ICD-10-CM | POA: Diagnosis not present

## 2023-03-02 DIAGNOSIS — Z79899 Other long term (current) drug therapy: Secondary | ICD-10-CM | POA: Insufficient documentation

## 2023-03-02 DIAGNOSIS — G40909 Epilepsy, unspecified, not intractable, without status epilepticus: Secondary | ICD-10-CM | POA: Insufficient documentation

## 2023-03-02 DIAGNOSIS — W231XXA Caught, crushed, jammed, or pinched between stationary objects, initial encounter: Secondary | ICD-10-CM | POA: Insufficient documentation

## 2023-03-02 DIAGNOSIS — S82831A Other fracture of upper and lower end of right fibula, initial encounter for closed fracture: Secondary | ICD-10-CM | POA: Diagnosis present

## 2023-03-02 DIAGNOSIS — S82871A Displaced pilon fracture of right tibia, initial encounter for closed fracture: Secondary | ICD-10-CM

## 2023-03-02 HISTORY — PX: ORIF ANKLE FRACTURE: SHX5408

## 2023-03-02 SURGERY — OPEN REDUCTION INTERNAL FIXATION (ORIF) ANKLE FRACTURE
Anesthesia: General | Site: Ankle | Laterality: Right

## 2023-03-02 MED ORDER — 0.9 % SODIUM CHLORIDE (POUR BTL) OPTIME
TOPICAL | Status: DC | PRN
  Administered 2023-03-02: 1000 mL

## 2023-03-02 MED ORDER — DOXYCYCLINE HYCLATE 100 MG PO TABS: 100.0000 mg | ORAL_TABLET | Freq: Two times a day (BID) | ORAL | 0 refills | Status: DC

## 2023-03-02 MED ORDER — MIDAZOLAM HCL 2 MG/2ML IJ SOLN
1.0000 mg | Freq: Once | INTRAMUSCULAR | Status: AC
  Administered 2023-03-02: 1 mg via INTRAVENOUS
  Filled 2023-03-02: qty 2

## 2023-03-02 MED ORDER — PROPOFOL 10 MG/ML IV BOLUS
INTRAVENOUS | Status: DC | PRN
  Administered 2023-03-02: 160 mg via INTRAVENOUS

## 2023-03-02 MED ORDER — FENTANYL CITRATE (PF) 100 MCG/2ML IJ SOLN
INTRAMUSCULAR | Status: DC | PRN
  Administered 2023-03-02 (×3): 25 ug via INTRAVENOUS

## 2023-03-02 MED ORDER — DROPERIDOL 2.5 MG/ML IJ SOLN: 0.6250 mg | Freq: Once | INTRAMUSCULAR | Status: DC | PRN

## 2023-03-02 MED ORDER — DEXAMETHASONE SODIUM PHOSPHATE 4 MG/ML IJ SOLN
INTRAMUSCULAR | Status: DC | PRN
  Administered 2023-03-02: 4 mg via INTRAVENOUS

## 2023-03-02 MED ORDER — LIDOCAINE HCL (CARDIAC) PF 100 MG/5ML IV SOSY
PREFILLED_SYRINGE | INTRAVENOUS | Status: DC | PRN
  Administered 2023-03-02: 80 mg via INTRAVENOUS

## 2023-03-02 MED ORDER — CHLORHEXIDINE GLUCONATE 0.12 % MT SOLN
15.0000 mL | Freq: Once | OROMUCOSAL | Status: AC
  Administered 2023-03-02: 15 mL via OROMUCOSAL

## 2023-03-02 MED ORDER — PROPOFOL 500 MG/50ML IV EMUL
INTRAVENOUS | Status: DC | PRN
  Administered 2023-03-02: 75 ug/kg/min via INTRAVENOUS

## 2023-03-02 MED ORDER — LACTATED RINGERS IV SOLN: INTRAVENOUS | Status: DC

## 2023-03-02 MED ORDER — FENTANYL CITRATE PF 50 MCG/ML IJ SOSY
50.0000 ug | PREFILLED_SYRINGE | Freq: Once | INTRAMUSCULAR | Status: AC
  Administered 2023-03-02: 50 ug via INTRAVENOUS
  Filled 2023-03-02: qty 2

## 2023-03-02 MED ORDER — FENTANYL CITRATE (PF) 100 MCG/2ML IJ SOLN
INTRAMUSCULAR | Status: AC
  Filled 2023-03-02: qty 2

## 2023-03-02 MED ORDER — ROPIVACAINE HCL 5 MG/ML IJ SOLN
INTRAMUSCULAR | Status: DC | PRN
  Administered 2023-03-02: 28 mL via PERINEURAL
  Administered 2023-03-02: 17 mL via PERINEURAL

## 2023-03-02 MED ORDER — ORAL CARE MOUTH RINSE: 15.0000 mL | Freq: Once | OROMUCOSAL | Status: AC

## 2023-03-02 MED ORDER — CEFAZOLIN SODIUM-DEXTROSE 2-4 GM/100ML-% IV SOLN
2.0000 g | INTRAVENOUS | Status: AC
  Administered 2023-03-02: 2 g via INTRAVENOUS
  Filled 2023-03-02: qty 100

## 2023-03-02 MED ORDER — CLONIDINE HCL (ANALGESIA) 100 MCG/ML EP SOLN
EPIDURAL | Status: DC | PRN
  Administered 2023-03-02: 50 ug
  Administered 2023-03-02: 30 ug

## 2023-03-02 MED ORDER — PROPOFOL 1000 MG/100ML IV EMUL
INTRAVENOUS | Status: AC
  Filled 2023-03-02: qty 100

## 2023-03-02 MED ORDER — ASPIRIN 325 MG PO TABS: ORAL_TABLET | ORAL | 0 refills | Status: AC

## 2023-03-02 MED ORDER — DEXAMETHASONE SODIUM PHOSPHATE 4 MG/ML IJ SOLN
INTRAMUSCULAR | Status: DC | PRN
  Administered 2023-03-02: 3 mg via PERINEURAL
  Administered 2023-03-02: 2 mg via PERINEURAL

## 2023-03-02 MED ORDER — LIDOCAINE HCL (PF) 2 % IJ SOLN
INTRAMUSCULAR | Status: AC
  Filled 2023-03-02: qty 5

## 2023-03-02 MED ORDER — FENTANYL CITRATE PF 50 MCG/ML IJ SOSY: 25.0000 ug | PREFILLED_SYRINGE | INTRAMUSCULAR | Status: DC | PRN

## 2023-03-02 MED ORDER — PHENYLEPHRINE HCL-NACL 20-0.9 MG/250ML-% IV SOLN
INTRAVENOUS | Status: DC | PRN
  Administered 2023-03-02: 40 ug/min via INTRAVENOUS

## 2023-03-02 MED ORDER — OXYCODONE HCL 5 MG PO TABS: ORAL_TABLET | ORAL | 0 refills | Status: DC

## 2023-03-02 MED ORDER — PROPOFOL 10 MG/ML IV BOLUS
INTRAVENOUS | Status: AC
  Filled 2023-03-02: qty 20

## 2023-03-02 MED ORDER — DEXAMETHASONE SODIUM PHOSPHATE 10 MG/ML IJ SOLN
INTRAMUSCULAR | Status: AC
Start: 2023-03-02 — End: ?
  Filled 2023-03-02: qty 1

## 2023-03-02 MED ORDER — ACETAMINOPHEN 500 MG PO TABS
1000.0000 mg | ORAL_TABLET | Freq: Once | ORAL | Status: AC
  Administered 2023-03-02: 1000 mg via ORAL
  Filled 2023-03-02: qty 2

## 2023-03-02 MED ORDER — BUPIVACAINE HCL (PF) 0.5 % IJ SOLN
INTRAMUSCULAR | Status: AC
  Filled 2023-03-02: qty 30

## 2023-03-02 MED ORDER — MEPERIDINE HCL 50 MG/ML IJ SOLN: 6.2500 mg | INTRAMUSCULAR | Status: DC | PRN

## 2023-03-02 SURGICAL SUPPLY — 62 items
APL PRP STRL LF DISP 70% ISPRP (MISCELLANEOUS) ×1
BAG COUNTER SPONGE SURGICOUNT (BAG) IMPLANT
BAG SPNG CNTER NS LX DISP (BAG)
BANDAGE ESMARK 6X9 LF (GAUZE/BANDAGES/DRESSINGS) IMPLANT
BIT DRILL 2 LNG CALIBR (DRILL) IMPLANT
BIT DRILL 2.5 CANN STRL (BIT) IMPLANT
BLADE SURG 15 STRL LF DISP TIS (BLADE) ×2 IMPLANT
BLADE SURG 15 STRL SS (BLADE) ×1
BNDG CMPR 5X4 CHSV STRCH STRL (GAUZE/BANDAGES/DRESSINGS)
BNDG CMPR 5X6 CHSV STRCH STRL (GAUZE/BANDAGES/DRESSINGS) ×1
BNDG CMPR 6 X 5 YARDS HK CLSR (GAUZE/BANDAGES/DRESSINGS) ×2
BNDG CMPR 9X6 STRL LF SNTH (GAUZE/BANDAGES/DRESSINGS)
BNDG COHESIVE 4X5 TAN STRL LF (GAUZE/BANDAGES/DRESSINGS) IMPLANT
BNDG COHESIVE 6X5 TAN ST LF (GAUZE/BANDAGES/DRESSINGS) IMPLANT
BNDG ELASTIC 6INX 5YD STR LF (GAUZE/BANDAGES/DRESSINGS) ×4 IMPLANT
BNDG ESMARK 6X9 LF (GAUZE/BANDAGES/DRESSINGS)
CHLORAPREP W/TINT 26 (MISCELLANEOUS) ×2 IMPLANT
COVER MAYO STAND STRL (DRAPES) IMPLANT
CUFF TOURN SGL QUICK 34 (TOURNIQUET CUFF) ×1
CUFF TRNQT CYL 34X4.125X (TOURNIQUET CUFF) ×2 IMPLANT
DRAPE C-ARM 42X120 X-RAY (DRAPES) IMPLANT
DRAPE C-ARMOR (DRAPES) IMPLANT
DRAPE U-SHAPE 47X51 STRL (DRAPES) ×2 IMPLANT
ELECT REM PT RETURN 15FT ADLT (MISCELLANEOUS) ×2 IMPLANT
GAUZE PAD ABD 8X10 STRL (GAUZE/BANDAGES/DRESSINGS) IMPLANT
GAUZE SPONGE 4X4 12PLY STRL (GAUZE/BANDAGES/DRESSINGS) ×2 IMPLANT
GAUZE XEROFORM 1X8 LF (GAUZE/BANDAGES/DRESSINGS) ×2 IMPLANT
GAUZE XEROFORM 5X9 LF (GAUZE/BANDAGES/DRESSINGS) IMPLANT
GLOVE BIOGEL PI IND STRL 8 (GLOVE) ×2 IMPLANT
GLOVE SURG LX STRL 7.5 STRW (GLOVE) ×2 IMPLANT
GOWN STRL REUS W/ TWL XL LVL3 (GOWN DISPOSABLE) ×2 IMPLANT
GOWN STRL REUS W/TWL XL LVL3 (GOWN DISPOSABLE) ×1
GUIDEWIRE 1.35MM (WIRE) IMPLANT
KIT BASIN OR (CUSTOM PROCEDURE TRAY) ×2 IMPLANT
KIT TURNOVER KIT A (KITS) IMPLANT
NS IRRIG 1000ML POUR BTL (IV SOLUTION) ×2 IMPLANT
PACK ORTHO EXTREMITY (CUSTOM PROCEDURE TRAY) ×2 IMPLANT
PAD CAST 4YDX4 CTTN HI CHSV (CAST SUPPLIES) ×2 IMPLANT
PADDING CAST COTTON 4X4 STRL (CAST SUPPLIES) ×2
PADDING CAST SYNTHETIC 4X4 STR (CAST SUPPLIES) ×4 IMPLANT
PENCIL SMOKE EVACUATOR (MISCELLANEOUS) ×2 IMPLANT
PLATE LOCK DIST FIB RT 5H (Plate) IMPLANT
SCREW LOCK COMP 3X16 (Screw) IMPLANT
SCREW LP CORT 3.0X20 (Screw) IMPLANT
SCREW LP TI 3.5X14MM (Screw) IMPLANT
SCREW LP TITANIUM 3.5X44 (Screw) IMPLANT
SCREW VAL KREULOCK 3.0X18 TI (Screw) IMPLANT
SPIKE FLUID TRANSFER (MISCELLANEOUS) IMPLANT
SPLINT PLASTER CAST XFAST 5X30 (CAST SUPPLIES) ×40 IMPLANT
STAPLER SKIN PROX WIDE 3.9 (STAPLE) IMPLANT
STOCKINETTE 3IN STRL (GAUZE/BANDAGES/DRESSINGS) IMPLANT
STRIP CLOSURE SKIN 1/2X4 (GAUZE/BANDAGES/DRESSINGS) IMPLANT
SUCTION TUBE FRAZIER 10FR DISP (SUCTIONS) ×2 IMPLANT
SUT ETHILON 3 0 PS 1 (SUTURE) IMPLANT
SUT MNCRL AB 3-0 PS2 18 (SUTURE) IMPLANT
SUT PDS AB 2-0 CT2 27 (SUTURE) IMPLANT
SUT VIC AB 2-0 SH 27 (SUTURE)
SUT VIC AB 2-0 SH 27XBRD (SUTURE) IMPLANT
SUT VIC AB 3-0 FS2 27 (SUTURE) ×2 IMPLANT
TOWEL OR 17X26 10 PK STRL BLUE (TOWEL DISPOSABLE) ×2 IMPLANT
UNDERPAD 30X36 HEAVY ABSORB (UNDERPADS AND DIAPERS) ×2 IMPLANT
YANKAUER SUCT BULB TIP 10FT TU (MISCELLANEOUS) ×2 IMPLANT

## 2023-03-02 NOTE — Anesthesia Preprocedure Evaluation (Addendum)
Anesthesia Evaluation  Patient identified by MRN, date of birth, ID band Patient awake    Reviewed: Allergy & Precautions, NPO status , Patient's Chart, lab work & pertinent test results  Airway Mallampati: II  TM Distance: >3 FB Neck ROM: Full    Dental  (+) Dental Advisory Given, Missing   Pulmonary Current Smoker   Pulmonary exam normal breath sounds clear to auscultation       Cardiovascular negative cardio ROS Normal cardiovascular exam Rhythm:Regular Rate:Normal     Neuro/Psych Seizures -,     GI/Hepatic Neg liver ROS,GERD  ,,  Endo/Other  negative endocrine ROS    Renal/GU negative Renal ROS     Musculoskeletal negative musculoskeletal ROS (+)    Abdominal  (+) + obese  Peds  Hematology negative hematology ROS (+)   Anesthesia Other Findings   Reproductive/Obstetrics                             Anesthesia Physical Anesthesia Plan  ASA: 3  Anesthesia Plan: General   Post-op Pain Management: Tylenol PO (pre-op)* and Regional block*   Induction: Intravenous  PONV Risk Score and Plan: 2 and Ondansetron, Dexamethasone and Treatment may vary due to age or medical condition  Airway Management Planned: LMA and Oral ETT  Additional Equipment:   Intra-op Plan:   Post-operative Plan: Extubation in OR  Informed Consent: I have reviewed the patients History and Physical, chart, labs and discussed the procedure including the risks, benefits and alternatives for the proposed anesthesia with the patient or authorized representative who has indicated his/her understanding and acceptance.     Dental advisory given  Plan Discussed with: CRNA  Anesthesia Plan Comments:         Anesthesia Quick Evaluation
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
## 2023-03-02 NOTE — Anesthesia Procedure Notes (Signed)
Anesthesia Regional Block: Adductor canal block   Pre-Anesthetic Checklist: , timeout performed,  Correct Patient, Correct Site, Correct Laterality,  Correct Procedure, Correct Position, site marked,  Risks and benefits discussed,  Surgical consent,  Pre-op evaluation,  At surgeon's request and post-op pain management  Laterality: Lower and Right  Prep: chloraprep       Needles:  Injection technique: Single-shot  Needle Type: Stimiplex     Needle Length: 9cm  Needle Gauge: 21     Additional Needles:   Procedures:,,,, ultrasound used (permanent image in chart),,    Narrative:  Start time: 03/02/2023 2:14 PM End time: 03/02/2023 2:29 PM Injection made incrementally with aspirations every 5 mL.  Performed by: Personally  Anesthesiologist: Lewie Loron, MD  Additional Notes: BP cuff, EKG monitors applied. Sedation begun. Artery and nerve location verified with ultrasound. Anesthetic injected incrementally (5ml), slowly, and after negative aspirations under direct u/s guidance. Good fascial/perineural spread. Tolerated well.

## 2023-03-02 NOTE — H&P (Signed)
PREOPERATIVE H&P  Chief Complaint: Right ankle pain  HPI: Diana Vega is a 67 y.o. female who presents for preoperative history and physical with a diagnosis of right intra-articular distal tibia/ankle fracture with associated fibula fracture and likely syndesmosis disruption.  She sustained an ankle fracture dislocation that had characteristics consistent with a pilon ankle fracture.  She had some tenting of the medial skin and underwent closed reduction.  She did have some eschar formation on the medial skin which was concerning.  She was placed in a splint in acceptable position.  She was instructed to elevate her leg prior to surgery.  She is here today for surgery . Symptoms are rated as moderate to severe, and have been worsening.  This is significantly impairing activities of daily living.  She has elected for surgical management.   Past Medical History:  Diagnosis Date   GERD (gastroesophageal reflux disease)    Meningitis    Neuromuscular disorder (HCC)    epipepsy   Seizure (HCC)    seizure 11/10/17   Past Surgical History:  Procedure Laterality Date   ABDOMINAL HYSTERECTOMY     APPENDECTOMY     CHOLECYSTECTOMY     Social History   Socioeconomic History   Marital status: Married    Spouse name: Not on file   Number of children: 4   Years of education: College   Highest education level: Not on file  Occupational History   Occupation: Teacher, adult education: [employer]  Tobacco Use   Smoking status: Every Day    Current packs/day: 0.50    Types: Cigarettes   Smokeless tobacco: Never  Vaping Use   Vaping status: Never Used  Substance and Sexual Activity   Alcohol use: No    Alcohol/week: 0.0 standard drinks of alcohol   Drug use: No   Sexual activity: Not on file  Other Topics Concern   Not on file  Social History Narrative   Patient lives at home with her husband.    Patient has 4 children.    Patient drinks 3 cups of caffeine daily.    Patient works as a Charity fundraiser  for [employer].    Social Determinants of Health   Financial Resource Strain: Not on file  Food Insecurity: Not on file  Transportation Needs: Not on file  Physical Activity: Not on file  Stress: Not on file  Social Connections: Not on file   Family History  Problem Relation Age of Onset   Cancer Mother        colon   Cancer Father        Breast   Cancer Brother        Prostate   Kidney failure Brother    Hepatitis C Brother    Cancer Brother        Prostate   Allergies  Allergen Reactions   Dilaudid [Hydromorphone Hcl]     acute renal failure    Betadine [Povidone-Iodine] Itching   Codeine     REACTION: GI upsets   Egg-Derived Products    Hydrocodone-Acetaminophen     Percocet- REACTION: GI upsets   Toradol [Ketorolac Tromethamine]     ? Acute renal failure.   Zofran [Ondansetron Hcl] Nausea And Vomiting   Tape Rash   Prior to Admission medications   Medication Sig Start Date End Date Taking? Authorizing Provider  divalproex (DEPAKOTE) 500 MG DR tablet TAKE 3 TABLETS BY MOUTH TWICE DAILY 03/02/22  Yes Butch Penny, NP  levETIRAcetam (KEPPRA) 500 MG tablet TAKE 1 TABLET BY MOUTH EVERY MORNING AND 2 AT BEDTIME 03/02/22  Yes Millikan, Megan, NP  oxyCODONE (OXY IR/ROXICODONE) 5 MG immediate release tablet Take 5 mg by mouth every 4 (four) hours as needed. 02/24/23  Yes [provider]  phenytoin (DILANTIN) 100 MG ER capsule TAKE 2 CAPSULES BY MOUTH TWICE DAILY 07/22/22  Yes Butch Penny, NP  aluminum-magnesium hydroxide 200-200 MG/5ML suspension Take 30 mLs by mouth every 6 (six) hours as needed for indigestion (Gastric issues.).    [provider]  ibuprofen (ADVIL,MOTRIN) 200 MG tablet Take 600 mg by mouth every 6 (six) hours as needed for headache or mild pain.    [provider]  morphine (MSIR) 15 MG tablet Take 1 tablet (15 mg total) by mouth every 6 (six) hours as needed for severe pain (pain score 7-10). 02/19/23   Gwyneth Sprout, MD     Positive ROS: All other systems have been reviewed and were otherwise negative with the exception of those mentioned in the HPI and as above.  Physical Exam:  Vitals:   03/02/23 1255  BP: 130/67  Pulse: 83  Resp: 16  Temp: 98.1 F (36.7 C)  SpO2: 95%   General: Alert, no acute distress Cardiovascular: No pedal edema Respiratory: No cyanosis, no use of accessory musculature GI: No organomegaly, abdomen is soft and non-tender Skin: No lesions in the area of chief complaint Neurologic: Sensation intact distally Psychiatric: Patient is competent for consent with normal mood and affect Lymphatic: No axillary or cervical lymphadenopathy  MUSCULOSKELETAL: Right ankle in a short leg splint.  Exposed forefoot is warm and well-perfused.  No tenderness proximal to the splint.  Assessment: Right distal tibial intra-articular fracture of the weightbearing surface of the tibia with associated fibula fracture and likely syndesmosis disruption   Plan: Plan for open treatment of her fractures and syndesmosis.  She did have some medial skin tenting at the time of her injury and I am concerned that there may be some necrotic eschar there.  We will see.  Hopefully not.  We will plan for open treatment of her fractures and stabilization.  If her skin is compromised we may consider an external fixator placement.  She may require wound VAC placement.  We discussed all of these things in detail.  She would like to proceed with surgery.  We discussed the risks, benefits and alternatives of surgery which include but are not limited to wound healing complications, infection, nonunion, malunion, need for further surgery, damage to surrounding structures and continued pain.  They understand there is no guarantees to an acceptable outcome.  After weighing these risks they opted to proceed with surgery.     Terance Hart, MD    03/02/2023 2:03 PM

## 2023-03-02 NOTE — Anesthesia Procedure Notes (Signed)
Procedure Name: LMA Insertion Date/Time: 03/02/2023 4:17 PM  Performed by: Maurene Capes, CRNAPre-anesthesia Checklist: Patient identified, Emergency Drugs available, Suction available and Patient being monitored Patient Re-evaluated:Patient Re-evaluated prior to induction Oxygen Delivery Method: Circle System Utilized Preoxygenation: Pre-oxygenation with 100% oxygen Induction Type: IV induction Ventilation: Mask ventilation without difficulty LMA: LMA inserted LMA Size: 4.0 Number of attempts: 1 Placement Confirmation: positive ETCO2 Tube secured with: Tape Dental Injury: Teeth and Oropharynx as per pre-operative assessment

## 2023-03-02 NOTE — Anesthesia Postprocedure Evaluation (Signed)
Anesthesia Post Note  Patient: Diana Vega  Procedure(s) Performed: TREATMENT OF RIGHT INTRA-ARTICULAR PILON ANKLE FRACTURE WITH ASSOCIATED FIBULA FRACTURE, OPEN TREATMENT OF SYNDESMOSIS (Right: Ankle)     Patient location during evaluation: PACU Anesthesia Type: General Level of consciousness: awake Pain management: pain level controlled Vital Signs Assessment: post-procedure vital signs reviewed and stable Respiratory status: spontaneous breathing Cardiovascular status: stable Postop Assessment: no apparent nausea or vomiting Anesthetic complications: no  No notable events documented.  Last Vitals:  Vitals:   03/02/23 1802 03/02/23 1815  BP: 122/61 (!) 113/56  Pulse: 82 77  Resp: (!) 9 15  Temp: 36.7 C   SpO2: 96% 96%    Last Pain:  Vitals:   03/02/23 1802  TempSrc:   PainSc: 0-No pain                 Caren Macadam

## 2023-03-02 NOTE — Transfer of Care (Signed)
Immediate Anesthesia Transfer of Care Note  Patient: Diana Vega  Procedure(s) Performed: TREATMENT OF RIGHT INTRA-ARTICULAR PILON ANKLE FRACTURE WITH ASSOCIATED FIBULA FRACTURE, OPEN TREATMENT OF SYNDESMOSIS (Right: Ankle)  Patient Location: PACU  Anesthesia Type:General  Level of Consciousness: drowsy and patient cooperative  Airway & Oxygen Therapy: Patient Spontanous Breathing and Patient connected to face mask oxygen  Post-op Assessment: Report given to RN and Post -op Vital signs reviewed and stable  Post vital signs: Reviewed and stable  Last Vitals:  Vitals Value Taken Time  BP 122/61 03/02/23 1802  Temp    Pulse 82 03/02/23 1803  Resp 14 03/02/23 1803  SpO2 96 % 03/02/23 1803  Vitals shown include unfiled device data.  Last Pain:  Vitals:   03/02/23 1440  TempSrc:   PainSc: Asleep         Complications: No notable events documented.

## 2023-03-02 NOTE — Discharge Instructions (Signed)
DR. Lucia Gaskins FOOT [facility] ANKLE SURGERY POST-OP INSTRUCTIONS   Pain Management The numbing medicine and your leg will last around 18 hours, take a dose of your pain medicine as soon as you feel it wearing off to avoid rebound pain. Keep your foot elevated above heart level.  Make sure that your heel hangs free ('floats'). Take all prescribed medication as directed. If taking narcotic pain medication you may want to use an over-the-counter stool softener to avoid constipation. You may take over-the-counter NSAIDs (ibuprofen, naproxen, etc.) as well as over-the-counter acetaminophen as directed on the packaging as a supplement for your pain and may also use it to wean away from the prescription medication.  Activity Non-weightbearing Keep splint intact  First Postoperative Visit Your first postop visit will be at least 2 weeks after surgery.  This should be scheduled when you schedule surgery. If you do not have a postoperative visit scheduled please call [phone] to schedule an appointment. At the appointment your incision will be evaluated for suture removal, x-rays will be obtained if necessary.  General Instructions Swelling is very common after foot and ankle surgery.  It often takes 3 months for the foot and ankle to begin to feel comfortable.  Some amount of swelling will persist for 6-12 months. DO NOT change the dressing.  If there is a problem with the dressing (too tight, loose, gets wet, etc.) please contact Dr. Pollie Friar office. DO NOT get the dressing wet.  For showers you can use an over-the-counter cast cover or wrap a washcloth around the top of your dressing and then cover it with a plastic bag and tape it to your leg. DO NOT soak the incision (no tubs, pools, bath, etc.) until you have approval from Dr. Lucia Gaskins.  Contact Dr. Huel Cote office or go to Emergency Room if: Temperature above 101 F. Increasing pain that is unresponsive to pain medication or elevation Excessive redness or  swelling in your foot Dressing problems - excessive bloody drainage, looseness or tightness, or if dressing gets wet Develop pain, swelling, warmth, or discoloration of your calf

## 2023-03-02 NOTE — Anesthesia Procedure Notes (Signed)
Anesthesia Regional Block: Popliteal block   Pre-Anesthetic Checklist: , timeout performed,  Correct Patient, Correct Site, Correct Laterality,  Correct Procedure, Correct Position, site marked,  Risks and benefits discussed,  Surgical consent,  Pre-op evaluation,  At surgeon's request and post-op pain management  Laterality: Lower and Right  Prep: chloraprep       Needles:  Injection technique: Single-shot  Needle Type: Stimiplex     Needle Length: 10cm  Needle Gauge: 21     Additional Needles:   Procedures:,,,, ultrasound used (permanent image in chart),,   Motor weakness within 5 minutes.  Narrative:  Start time: 03/02/2023 2:29 PM End time: 03/02/2023 2:35 PM Injection made incrementally with aspirations every 5 mL.  Performed by: Personally  Anesthesiologist: Lewie Loron, MD  Additional Notes: Nerve located and needle positioned with direct ultrasound guidance. Good perineural spread. Patient tolerated well.

## 2023-03-03 ENCOUNTER — Encounter (HOSPITAL_COMMUNITY): Payer: Self-pay | Admitting: Orthopaedic Surgery

## 2023-04-30 ENCOUNTER — Other Ambulatory Visit: Payer: Self-pay | Admitting: Adult Health

## 2023-06-29 ENCOUNTER — Telehealth: Payer: Self-pay | Admitting: Adult Health

## 2023-06-29 DIAGNOSIS — G40309 Generalized idiopathic epilepsy and epileptic syndromes, not intractable, without status epilepticus: Secondary | ICD-10-CM

## 2023-06-29 DIAGNOSIS — Z5181 Encounter for therapeutic drug level monitoring: Secondary | ICD-10-CM

## 2023-06-29 NOTE — Telephone Encounter (Signed)
 LVM for pt to call back to discuss DEXA scan

## 2023-06-29 NOTE — Telephone Encounter (Signed)
 Received notification from her insurance about considering osteoporosis screening due to recent fracture.  Can you check with primary care provider to see if they are doing screening.  If not we can order a DEXA scan if patient is amenable

## 2023-06-30 NOTE — Telephone Encounter (Signed)
 Called the pt and LVM (ok per DPR) asking for call back to schedule appt with Dr Vickey Huger within the next couple of months and also let her know that [facility] NP approved the orders to check pt's levels of keppra, dilantin, and depakote. I advised her that we would like to check a level just prior to her taking the medication. Unsure when she typically takes her morning dose but the example is that she could come to the office when the lab opens at 8 am and take her medication right after the lab is drawn. I asked pt to let us know if there are any questions about this. Left office number for call back.

## 2023-06-30 NOTE — Addendum Note (Signed)
 Addended by: Bertram Savin on: 06/30/2023 02:29 PM   Modules accepted: Orders

## 2023-06-30 NOTE — Telephone Encounter (Signed)
 Unable to see a pcp listed on patient's chart so I called her.  She states her previous doctor retired and she will need to find a new one. She is in the process of looking but so far many are not taking new patients. I asked her if she needed a link to [facility] provider webpage and she said she had it. Offered dexa scan in the meantime. Right now she prefers to hold on the  scan but agreed to discuss with her next PCP provider.  We discussed her recent fracture.  In October she had a seizure and fell and broke her ankle.  She had an ORIF to repair.  She reports the reason for her seizure was due to 3 missed doses of all of her seizure medications. Patient takes them twice daily at the same time. She had company in town and did not realize she had missed them until it was too late. She states it will not happen again. She is taking her dilantin, depakote, and keppra all as prescribed.  She denies any further seizures. She states she will not drive for 6 months.  She is asking for her levels to be drawn.  I told her I would review her request with [facility] NP.

## 2023-07-01 NOTE — Telephone Encounter (Signed)
 I spoke with the patient.  She will plan to come in and have her labs drawn next Tuesday around 11 am to 12 pm just before she takes her AM dose.  I let her know that Megan NP would like for the patient to schedule an appointment with MD in the next couple of months.  Patient states she will get back to [place] as she did not have her schedule with her.  She thanked me for the call and her questions were answered.  When the patient calls back, please schedule her with Dr Vickey Huger in the next couple of months.

## 2023-07-06 ENCOUNTER — Other Ambulatory Visit (INDEPENDENT_AMBULATORY_CARE_PROVIDER_SITE_OTHER): Payer: Self-pay

## 2023-07-06 DIAGNOSIS — G40309 Generalized idiopathic epilepsy and epileptic syndromes, not intractable, without status epilepticus: Secondary | ICD-10-CM

## 2023-07-06 DIAGNOSIS — Z0289 Encounter for other administrative examinations: Secondary | ICD-10-CM

## 2023-07-06 DIAGNOSIS — Z5181 Encounter for therapeutic drug level monitoring: Secondary | ICD-10-CM

## 2023-07-07 ENCOUNTER — Encounter: Payer: Self-pay | Admitting: *Deleted

## 2023-07-07 LAB — VALPROIC ACID LEVEL: Valproic Acid Lvl: 37 ug/mL — ABNORMAL LOW (ref 50–100)

## 2023-07-07 LAB — PHENYTOIN LEVEL, TOTAL: Phenytoin (Dilantin), Serum: 8.8 ug/mL — ABNORMAL LOW (ref 10.0–20.0)

## 2023-07-07 LAB — LEVETIRACETAM LEVEL: Levetiracetam Lvl: 14.2 ug/mL (ref 10.0–40.0)

## 2023-08-01 ENCOUNTER — Other Ambulatory Visit: Payer: Self-pay | Admitting: Adult Health

## 2023-09-06 ENCOUNTER — Other Ambulatory Visit: Payer: Self-pay

## 2023-09-06 ENCOUNTER — Encounter: Payer: Self-pay | Admitting: Neurology

## 2023-09-06 ENCOUNTER — Ambulatory Visit (INDEPENDENT_AMBULATORY_CARE_PROVIDER_SITE_OTHER): Admitting: Neurology

## 2023-09-06 VITALS — BP 118/82 | HR 79 | Ht 62.0 in | Wt 189.0 lb

## 2023-09-06 DIAGNOSIS — Z9189 Other specified personal risk factors, not elsewhere classified: Secondary | ICD-10-CM | POA: Diagnosis not present

## 2023-09-06 DIAGNOSIS — Z5181 Encounter for therapeutic drug level monitoring: Secondary | ICD-10-CM | POA: Diagnosis not present

## 2023-09-06 DIAGNOSIS — M8580 Other specified disorders of bone density and structure, unspecified site: Secondary | ICD-10-CM

## 2023-09-06 DIAGNOSIS — Z780 Asymptomatic menopausal state: Secondary | ICD-10-CM

## 2023-09-06 DIAGNOSIS — G40309 Generalized idiopathic epilepsy and epileptic syndromes, not intractable, without status epilepticus: Secondary | ICD-10-CM | POA: Diagnosis not present

## 2023-09-06 DIAGNOSIS — S8261XD Displaced fracture of lateral malleolus of right fibula, subsequent encounter for closed fracture with routine healing: Secondary | ICD-10-CM | POA: Insufficient documentation

## 2023-09-06 MED ORDER — PHENYTOIN SODIUM EXTENDED 100 MG PO CAPS: 200.0000 mg | ORAL_CAPSULE | Freq: Two times a day (BID) | ORAL | 3 refills | Status: AC

## 2023-09-06 MED ORDER — DIVALPROEX SODIUM 500 MG PO DR TAB: 1500.0000 mg | DELAYED_RELEASE_TABLET | Freq: Two times a day (BID) | ORAL | 3 refills | Status: AC

## 2023-09-06 MED ORDER — LEVETIRACETAM 500 MG PO TABS: ORAL_TABLET | ORAL | 3 refills | Status: AC

## 2023-09-06 NOTE — Addendum Note (Signed)
 Addended by: Neomia Banner on: 09/06/2023 11:26 AM   Modules accepted: Orders

## 2023-09-06 NOTE — Progress Notes (Signed)
 [facility]  Provider:  Dr Virgel Haro Referring Provider: Clem Currier, NP Primary Care Physician:  Clem Currier, NP  Chief Complaint  Patient presents with   Follow-up    Pt alone, rm . Here following up. Last sz was October. States that she is taking her medication. She had surgery oct as well due to shattered right ankle and fractured had surgery.      HPI:  Diana Vega is a 68 y.o. female and seen here upon  TOC from Dr Tilda Fogo, had meanwhile followed up with NP [place]. Here for a Consultation/ Evaluation of  recent (02-19-2023) Seizure break through, she was on her porch and in the fall twisted her ankle, fractured right foot, now has a plate and screws, now needs a crutch for balance. Still has swelling in the left foot. Surgeon is  Dr Hulda Mage at [facility]. Last Seizure before this one was 8 years ago .   This patient reports a seizure history since age 48, dx in Nursing school.  Dr Tilda Fogo treated her.  She has had primary convulsive and 2 staring seizures, but she also described 2 seizures where she would turn her head to the left, couldn't speak before the body turned to the left.    She has been on 3  epilepsy medications- maintenance drugs and had no bone density scan yet.    Mother is 58 years -old , father is 81 years old! , father is physically frail. Father was a Quarry manager in his last career. He is a [employer].   mother with vascular dementia.  Mother with osteoporosis. Married for 82 years.   Social Hx. One of 6 children, all went to college . Moved here to [facility] n 2003, from [place] .  RN for 45 years.  One son, Zoila Hines is 66 years-old and lives in [place] . Catholic . Active smoker,  no ETOH. Drinks 4 cups of coffee a day.     Today 01/15/23:   Diana Vega is a 68 y.o. female with a history of Seizures. Returns today for follow-up.  She remains on Dilantin , Depakote  and Keppra .  Denies any seizure events.  No  change in mood or behavior.  No change in gait or balance.  Able to complete all ADLs independently.  Continues to operate a motor vehicle.  Blood work was ordered at the last visit however she did not have this done.  Returns today for an evaluation.     01/20/22: MM [name] is a 68 year old female with a history of seizures.  She returns today for virtual visit.  She remains on Dilantin  Depakote  and Keppra .  Denies any seizure events.  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  She returns today for an evaluation.   01/20/21:MM   [name] is a 68 year old female with a history of seizures.  She returns today for follow-up.  She has not had any seizure events.  Remains on Dilantin , Depakote  and Keppra .  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  Returns today for evaluation.   01/15/20: MM [name], LPN ,  is a 68 year old female with a history of seizures.  She returns today for follow-up.  Overall she feels that she is doing well.  She is not had any seizure events.  Remains on Dilantin , Depakote  and Keppra .  Denies any new symptoms.  Returns today for an evaluation.  Review of Systems: Out of a  complete 14 system review, the patient complains of only the following symptoms, and all other reviewed systems are negative.   Tremor from Depakote  - remains on 500 mg tab, 3 tab bid .  Dizziness and nightmares on Keppra . 500 mg in AM and 1000 mg pm.  Dilantin : Gum hyperplasia. 200 mg twice a day.    Social History   Socioeconomic History   Marital status: Married    Spouse name: Not on file   Number of children: 4   Years of education: College   Highest education level: Not on file  Occupational History   Occupation: Teacher, adult education: [employer]  Tobacco Use   Smoking status: Every Day    Current packs/day: 0.50    Types: Cigarettes   Smokeless tobacco: Never  Vaping Use   Vaping status: Never Used  Substance and Sexual  Activity   Alcohol use: No    Alcohol/week: 0.0 standard drinks of alcohol   Drug use: No   Sexual activity: Not on file  Other Topics Concern   Not on file  Social History Narrative   Patient lives at home with her husband.    Patient has 4 children.    Patient drinks 3 cups of caffeine daily.    Patient works as a Charity fundraiser for [employer].    Social Drivers of Corporate investment banker Strain: Not on file  Food Insecurity: Not on file  Transportation Needs: Not on file  Physical Activity: Not on file  Stress: Not on file  Social Connections: Not on file  Intimate Partner Violence: Not on file    Family History  Problem Relation Age of Onset   Cancer Mother        colon   Cancer Father        Breast   Cancer Brother        Prostate   Kidney failure Brother    Hepatitis C Brother    Cancer Brother        Prostate    Past Medical History:  Diagnosis Date   GERD (gastroesophageal reflux disease)    Meningitis    Neuromuscular disorder (HCC)    epipepsy   Seizure (HCC)    seizure 11/10/17    Past Surgical History:  Procedure Laterality Date   ABDOMINAL HYSTERECTOMY     APPENDECTOMY     CHOLECYSTECTOMY     ORIF ANKLE FRACTURE Right 03/02/2023   Procedure: TREATMENT OF RIGHT INTRA-ARTICULAR PILON ANKLE FRACTURE WITH ASSOCIATED FIBULA FRACTURE, OPEN TREATMENT OF SYNDESMOSIS;  Surgeon: Donnamarie Gables, MD;  Location: [facility];  Service: Orthopedics;  Laterality: Right;    Current Outpatient Medications  Medication Sig Dispense Refill   aluminum-magnesium hydroxide 200-200 MG/5ML suspension Take 30 mLs by mouth every 6 (six) hours as needed for indigestion (Gastric issues.).     aspirin  (BAYER ASPIRIN ) 325 MG tablet Take 1 tablet by mouth for 30 DAYS for blood clot prevention 30 tablet 0   divalproex  (DEPAKOTE ) 500 MG DR tablet Take 3 tablets (1,500 mg total) by mouth 2 (two) times daily. 540 tablet 2   ibuprofen (ADVIL,MOTRIN) 200 MG tablet Take 600 mg by mouth  every 6 (six) hours as needed for headache or mild pain.     levETIRAcetam  (KEPPRA ) 500 MG tablet Take 1 tablet (500 mg total) by mouth every morning AND 2 tablets (1,000 mg total) at bedtime. 270 tablet 2   phenytoin  (DILANTIN ) 100 MG ER capsule TAKE 2  CAPSULES BY MOUTH TWICE A DAY 360 capsule 0   No current facility-administered medications for this visit.    Allergies as of 09/06/2023 - Review Complete 09/06/2023  Allergen Reaction Noted   Dilaudid [hydromorphone hcl]  09/28/2013   Betadine [povidone-iodine] Itching 03/01/2023   Codeine     Egg-derived products     Hydrocodone -acetaminophen      Toradol [ketorolac tromethamine]  10/02/2014   Zofran  [ondansetron  hcl] Nausea And Vomiting 11/19/2017   Tape Rash 03/02/2023    Vitals: BP 118/82   Pulse 79   Ht 5\' 2"  (1.575 m)   Wt 189 lb (85.7 kg)   BMI 34.57 kg/m  Last Weight:  Wt Readings from Last 1 Encounters:  09/06/23 189 lb (85.7 kg)   Last Height:   Ht Readings from Last 1 Encounters:  09/06/23 5\' 2"  (1.575 m)   Last BMI: @LASTBMI  Physical exam:  General: The patient is awake, alert and appears not in acute distress.  The patient is well groomed. Head: Normocephalic, atraumatic.  Neck is supple. Retrognathia.   Neck circumference:13.25" Mallampati1-2,, crowded, wore braces. Had teeth extracted.  Cardiovascular:  Regular rate and palpable peripheral pulse:  Respiratory: clear to auscultation.  Skin:  Without evidence of rash Trunk: BMI is 34.6  and patient has normal posture.   Neurologic exam : The patient is awake and alert, oriented to place and time.  Memory subjective described as intact.  There is a normal attention span & concentration ability.  Speech is fluent without dysarthria, dysphonia or aphasia.  Mood and affect are appropriate.  Cranial nerves: Pupils are equal and briskly reactive to light. Extraocular movements  in vertical and horizontal planes intact and without nystagmus. Visual fields  by finger perimetry are intact. Hearing to finger rub intact.  Facial sensation intact to fine touch. Facial motor strength is symmetric and tongue and uvula move midline.  Motor exam:   Normal tone and normal muscle bulk and symmetric normal strength in both upper  extremities. Grip Strength equal. Proximal strength of shoulder muscles and hip flexors was intact.   Sensory:  Fine touch and vibration were tested . Proprioception was tested in the upper extremities only and was  normal.  Coordination: Rapid alternating movements in the fingers/hands were normal.  Finger-to-nose maneuver was tested and showed no evidence of ataxia, dysmetria or there is a very mild action non-resting tremor.   Gait and station: Patient walked with assistive device  due to injury .  Has scoliosis.  Deep tendon reflexes: in the  upper and lower extremities are symmetric and  brisk without Clonus. Babinski maneuver response is  downgoing.   Assessment: Total time for face to face interview and examination, for review of  images and laboratory testing, neurophysiology testing and pre-existing records, including out-of -network , was 35 minutes. Assessment is as follows here:  1)   breakthrough seizure after forgetting to take the AM medication.  2)  refilled all meds in the same manner as she is accustomed to.  3)  no need to repeat an EEG as she had always ongoing activity on medications. She had a last EEG in 2005 (?) but none are documented here.    Plan:  Treatment plan and additional workup planned after today includes:    Refilled all meds in the same manner as she is accustomed to.  I offered to repeat an EEG, no need for MRI but bone density scan DEXA. She will soon have a colonoscopy and mammogram  as well ( PCP )   .  Labs for trough levels.  RV in 6 months with MM, NP    Neomia Banner, MD  [facility] and [facility] Sleep Board certified by The [employer] of Sleep  Medicine and Diplomate of the [employer] of Sleep Medicine. Board certified In Neurology through the ABPN, Fellow of the [employer] of Neurology.

## 2023-09-06 NOTE — Patient Instructions (Signed)
 Phenytoin  Capsules What is this medication? PHENYTOIN  (FEN i toyn) prevents and controls seizures in people with epilepsy. It may also be used to prevent and treat seizures during or after brain surgery. It works by calming overactive nerves in your body. This medicine may be used for other purposes; ask your health care provider or pharmacist if you have questions. COMMON BRAND NAME(S): Dilantin , Phenytek  What should I tell my care team before I take this medication? They need to know if you have any of these conditions: Alcohol use disorder Asian ancestry Blood disorders or disease Diabetes Heart problems Kidney disease Liver disease Porphyria Recent or ongoing radiation Suicidal thoughts, plans, or attempt by you or a family member Thyroid  disease An unusual or allergic reaction to phenytoin , other medications, foods, dyes, or preservatives Pregnant or trying to get pregnant Breastfeeding How should I use this medication? Take this medication by mouth with water. Take it as directed on the prescription label at the same time every day. Do not cut, crush, or chew this medication. Swallow the capsules whole. You can take it with or without food. You should always take it the same way. Keep taking it unless your care team tells you to stop. A special MedGuide will be given to you by the pharmacist with each prescription and refill. Be sure to read this information carefully each time. Talk to your care team about the use of this medication in children. While it may be prescribed for to children for selected conditions, precautions do apply. Overdosage: If you think you have taken too much of this medicine contact a poison control center or emergency room at once. NOTE: This medicine is only for you. Do not share this medicine with others. What if I miss a dose? If you miss a dose, take it as soon as you can. If it is almost time for your next dose, take only that dose. Do not take double  or extra doses. What may interact with this medication? Do not take this medication with any of the following: Artemether Certain antivirals for HIV or hepatitis Idelalisib Isavuconazonium Lonafarnib Lorlatinib Lumefantrine Lurasidone Pacritinib Praziquantel Ranolazine This medication may also interact with the following: Albendazole Alcohol Aspirin  and aspirin -like medications Bleomycin Capecitabine Carboplatin Certain medications for blood pressure, heart disease, irregular heartbeat Certain medications for depression, anxiety, or other mental health conditions Certain medications for cholesterol, such as atorvastatin, fluvastatin, simvastatin Certain medications for fungal infections, such as fluconazole, itraconazole, ketoconazole, miconazole, posaconazole, voriconazole Certain medications for seizures, such as carbamazepine, ethosuximide, felbamate, lacosamide, lamotrigine, methsuximide, oxcarbazepine, phenobarbital, topiramate, valproic acid , vigabatrin Certain medications for stomach problems, such as antacids, cimetidine, omeprazole, sucralfate Certain medications that prevent or treat blood clots, such as warfarin, apixaban, dabigatran, edoxaban, and rivaroxaban Chloramphenicol Chlorpropamide Cisplatin Cyclosporine Disulfiram Doxorubicin Doxycycline  Estrogen or progestin hormones Fluorouracil Folic acid Irinotecan Isoniazid Medications that relax muscles for surgery Methadone Methotrexate Methylphenidate Paclitaxel Phenothiazines, such as chlorpromazine, mesoridazine, prochlorperazine, thioridazine Rifampin St. John's wort Steroid medications, such as prednisone or cortisone Sulfadiazine Sulfamethoxazole Teniposide Theophylline Ticagrelor Ticlopidine Tolbutamide Vitamin D This list may not describe all possible interactions. Give your health care provider a list of all the medicines, herbs, non-prescription drugs, or dietary supplements you use. Also  tell them if you smoke, drink alcohol, or use illegal drugs. Some items may interact with your medicine. What should I watch for while using this medication? Visit your care team for regular checks on your progress. This medication needs careful monitoring. Your care team may schedule  regular blood tests. This medication may cause serious skin reactions. They can happen weeks to months after starting the medication. Contact your care team right away if you notice fevers or flu-like symptoms with a rash. The rash may be red or purple and then turn into blisters or peeling of the skin. You may also notice a red rash with swelling of the face, lips, or lymph nodes in your neck or under your arms. Wear a medical ID bracelet or chain, and carry a card that describes your disease and details of your medication and dosage times. Do not change brands or dosage forms of this medication without discussing the change with your care team. This medication may affect your coordination, reaction time, or judgment. Do not drive or operate machinery until you know how this medication affects you. Sit up or stand slowly to reduce the risk of dizzy or fainting spells. Drinking alcohol with this medication can increase the risk of these side effects. Estrogen and progestin hormones may not work as well while you are taking this medication. A barrier contraceptive, such as a condom or diaphragm, is recommended if you are using these hormones for contraception. Talk to your care team about effective forms of contraception. This medication can cause tooth and gum problems. Tenderness, swelling, or minor bleeding of the gums may occur. Brushing and flossing your teeth regularly may reduce the risk of side effects. Visit your dentist on a regular basis. Tell your dentist about any medications you are taking. Do not take antacids at the same time as this medication. If you get an upset stomach and want to take an antacid or  medication for diarrhea, make sure there is an interval of 2 to 3 hours before or after you took your phenytoin . This medication may cause thoughts of suicide or depression. This includes sudden changes in mood, behaviors, or thoughts. These changes can happen at any time but are more common in the beginning of treatment or after a change in dose. Call your care team right away if you experience these thoughts or worsening depression. Those who become pregnant while using this medication may enroll in the [place] American Antiepileptic Drug Pregnancy Registry by calling [phone]. This registry collects information about the safety of antiepileptic medication use during pregnancy. This medication may cause a decrease in vitamin D and folic acid. You should make sure that you get enough vitamins while you are taking this medication. Discuss the foods you eat and the vitamins you take with your care team. What side effects may I notice from receiving this medication? Side effects that you should report to your care team as soon as possible: Allergic reactions--skin rash, itching, hives, swelling of the face, lips, tongue, or throat High blood sugar (hyperglycemia)--increased thirst or amount of urine, unusual weakness or fatigue, blurry vision Infection--fever, chills, cough, or sore throat Liver injury--right upper belly pain, loss of appetite, nausea, light-colored stool, dark yellow or brown urine, yellowing skin or eyes, unusual weakness or fatigue Phenytoin  toxicity--uncontrollable eye movements, loss of balance or coordination, trouble speaking, unusual weakness or fatigue, nausea, vomiting Rash, fever, and swollen lymph nodes Redness, blistering, peeling, or loosening of the skin, including inside the mouth Slow heartbeat--dizziness, feeling faint or lightheaded, confusion, trouble breathing, unusual weakness or fatigue Swollen lymph nodes in the neck, groin, chest, or underarm area Thoughts  of suicide or self-harm, worsening mood, or feelings of depression Unusual bruising or bleeding Side effects that usually do not require medical attention (  report to your care team if they continue or are bothersome): Difficulty with paying attention, memory, or speech Dizziness Drowsiness Headache This list may not describe all possible side effects. Call your doctor for medical advice about side effects. You may report side effects to FDA at 1-800-FDA-1088. Where should I keep my medication? Keep out of the reach of children. Store at room temperature between 15 and 30 degrees C (59 and 86 degrees F). Protect from light and moisture. Throw away any unused medication after the expiration date. NOTE: This sheet is a summary. It may not cover all possible information. If you have questions about this medicine, talk to your doctor, pharmacist, or health care provider.  2024 Elsevier/Gold Standard (2021-10-29 00:00:00)

## 2023-09-06 NOTE — Addendum Note (Signed)
 Addended by: Neomia Banner on: 09/06/2023 11:27 AM   Modules accepted: Orders

## 2023-09-06 NOTE — Addendum Note (Signed)
 Addended by: Elton Ham on: 09/06/2023 01:26 PM   Modules accepted: Orders

## 2023-09-07 LAB — COMPREHENSIVE METABOLIC PANEL WITH GFR
ALT: 8 IU/L (ref 0–32)
AST: 13 IU/L (ref 0–40)
Albumin: 4.3 g/dL (ref 3.9–4.9)
Alkaline Phosphatase: 101 IU/L (ref 44–121)
BUN/Creatinine Ratio: 28 (ref 12–28)
BUN: 16 mg/dL (ref 8–27)
Bilirubin Total: <0.2 mg/dL (ref 0.0–1.2)
CO2: 22 mmol/L (ref 20–29)
Calcium: 9.4 mg/dL (ref 8.7–10.3)
Chloride: 100 mmol/L (ref 96–106)
Creatinine, Ser: 0.57 mg/dL (ref 0.57–1.00)
Globulin, Total: 2.2 g/dL (ref 1.5–4.5)
Glucose: 87 mg/dL (ref 70–99)
Potassium: 4.6 mmol/L (ref 3.5–5.2)
Sodium: 138 mmol/L (ref 134–144)
Total Protein: 6.5 g/dL (ref 6.0–8.5)
eGFR: 100 mL/min/{1.73_m2} (ref >59)

## 2023-09-07 LAB — CBC WITH DIFFERENTIAL/PLATELET
Basophils Absolute: 0 10*3/uL (ref 0.0–0.2)
Basos: 1 %
EOS (ABSOLUTE): 0.1 10*3/uL (ref 0.0–0.4)
Eos: 1 %
Hematocrit: 42.3 % (ref 34.0–46.6)
Hemoglobin: 14.7 g/dL (ref 11.1–15.9)
Immature Grans (Abs): 0 10*3/uL (ref 0.0–0.1)
Immature Granulocytes: 0 %
Lymphocytes Absolute: 3.9 10*3/uL — ABNORMAL HIGH (ref 0.7–3.1)
Lymphs: 45 %
MCH: 32.2 pg (ref 26.6–33.0)
MCHC: 34.8 g/dL (ref 31.5–35.7)
MCV: 93 fL (ref 79–97)
Monocytes Absolute: 0.7 10*3/uL (ref 0.1–0.9)
Monocytes: 8 %
Neutrophils Absolute: 4.1 10*3/uL (ref 1.4–7.0)
Neutrophils: 45 %
Platelets: 260 10*3/uL (ref 150–450)
RBC: 4.56 x10E6/uL (ref 3.77–5.28)
RDW: 12.5 % (ref 11.7–15.4)
WBC: 8.8 10*3/uL (ref 3.4–10.8)

## 2023-09-07 LAB — VALPROIC ACID LEVEL: Valproic Acid Lvl: 88 ug/mL (ref 50–100)

## 2023-09-08 ENCOUNTER — Telehealth: Payer: Self-pay | Admitting: *Deleted

## 2023-09-08 NOTE — Telephone Encounter (Addendum)
 Spoke to patient gave lab results Pt states already read results on mychart but was appreciative for the call . Pt states no questions at this time . Patient thanked me for calling

## 2023-09-08 NOTE — Telephone Encounter (Signed)
-----   Message from [place] Dohmeier sent at 09/07/2023  7:26 PM EDT ----- Normal CBC, CMET- can continue with current therapy/ medication . Therapeutic level of depakote / valproic acid   at  88 .

## 2023-09-13 ENCOUNTER — Ambulatory Visit (INDEPENDENT_AMBULATORY_CARE_PROVIDER_SITE_OTHER): Admitting: Neurology

## 2023-09-13 DIAGNOSIS — G40309 Generalized idiopathic epilepsy and epileptic syndromes, not intractable, without status epilepticus: Secondary | ICD-10-CM | POA: Diagnosis not present

## 2023-09-13 DIAGNOSIS — S8261XD Displaced fracture of lateral malleolus of right fibula, subsequent encounter for closed fracture with routine healing: Secondary | ICD-10-CM

## 2023-09-13 DIAGNOSIS — Z780 Asymptomatic menopausal state: Secondary | ICD-10-CM

## 2023-09-13 DIAGNOSIS — Z9189 Other specified personal risk factors, not elsewhere classified: Secondary | ICD-10-CM

## 2023-09-13 DIAGNOSIS — Z5181 Encounter for therapeutic drug level monitoring: Secondary | ICD-10-CM

## 2023-09-14 ENCOUNTER — Ambulatory Visit: Payer: Self-pay | Admitting: Neurology

## 2023-09-14 LAB — PHENYTOIN LEVEL, FREE AND TOTAL: Phenytoin, Serum: 13.6 ug/mL (ref 10.0–20.0)

## 2023-09-14 NOTE — Procedures (Signed)
    History:  68 year old woman with seizure disorder   EEG classification: Awake and drowsy  Duration: 26 minutes   Technical aspects: This EEG study was done with scalp electrodes positioned according to the 10-20 International system of electrode placement. Electrical activity was reviewed with band pass filter of 1-70Hz , sensitivity of 7 uV/mm, display speed of 6mm/sec with a 60Hz  notched filter applied as appropriate. EEG data were recorded continuously and digitally stored.   Description of the recording: The background rhythms of this recording consists of a fairly well modulated medium amplitude alpha rhythm of 10 Hz that is reactive to eye opening and closure. Present in the anterior head region is a 15-20 Hz beta activity. Photic stimulation was performed, did not show any abnormalities. Hyperventilation was also performed, did not show any abnormalities. Drowsiness was manifested by background fragmentation. No abnormal epileptiform discharges seen during this recording. There was intermittent left frontal focal slowing. There were no electrographic seizure identified.   Abnormality: Intermittent left frontal slowing   Impression: This is an abnormal awake and drowsy EEG due to presence of intermittent left frontal slowing. This is consistent with an area of neuronal dysfunction in the left frontal region.    Malike Foglio, MD [facility]

## 2023-09-20 ENCOUNTER — Ambulatory Visit (HOSPITAL_BASED_OUTPATIENT_CLINIC_OR_DEPARTMENT_OTHER)
Admission: RE | Admit: 2023-09-20 | Discharge: 2023-09-20 | Disposition: A | Discharge status: Home / Self Care | Source: Ambulatory Visit | Attending: Neurology | Admitting: Neurology

## 2023-09-20 DIAGNOSIS — Z780 Asymptomatic menopausal state: Secondary | ICD-10-CM | POA: Diagnosis present

## 2023-09-20 DIAGNOSIS — Z9189 Other specified personal risk factors, not elsewhere classified: Secondary | ICD-10-CM | POA: Diagnosis present

## 2023-09-20 DIAGNOSIS — Z5181 Encounter for therapeutic drug level monitoring: Secondary | ICD-10-CM | POA: Insufficient documentation

## 2023-09-20 DIAGNOSIS — M8580 Other specified disorders of bone density and structure, unspecified site: Secondary | ICD-10-CM | POA: Insufficient documentation

## 2023-09-20 DIAGNOSIS — G40309 Generalized idiopathic epilepsy and epileptic syndromes, not intractable, without status epilepticus: Secondary | ICD-10-CM | POA: Diagnosis present

## 2023-09-20 DIAGNOSIS — S8261XD Displaced fracture of lateral malleolus of right fibula, subsequent encounter for closed fracture with routine healing: Secondary | ICD-10-CM | POA: Insufficient documentation

## 2024-01-18 ENCOUNTER — Telehealth: Payer: Medicare Other | Admitting: Adult Health

## 2024-01-18 DIAGNOSIS — N959 Unspecified menopausal and perimenopausal disorder: Secondary | ICD-10-CM

## 2024-01-18 DIAGNOSIS — Z9189 Other specified personal risk factors, not elsewhere classified: Secondary | ICD-10-CM

## 2024-01-18 DIAGNOSIS — R569 Unspecified convulsions: Secondary | ICD-10-CM | POA: Diagnosis not present

## 2024-01-18 DIAGNOSIS — G40309 Generalized idiopathic epilepsy and epileptic syndromes, not intractable, without status epilepticus: Secondary | ICD-10-CM

## 2024-01-18 NOTE — Patient Instructions (Signed)
 Your Plan:  Continue Keppra , Dilantin  and Depakote   SEIZURE PRECAUTIONS Per [place]  DMV statutes, patients with seizures are not allowed to drive until they have been seizure-free for six months.    Use caution when using heavy equipment or power tools. Avoid working on ladders or at heights. Take showers instead of baths. Ensure the water temperature is not too high on the home water heater. Do not go swimming alone. Do not lock yourself in a room alone (i.e. bathroom). When caring for infants or small children, sit down when holding, feeding, or changing them to minimize risk of injury to the child in the event you have a seizure. Maintain good sleep hygiene. Avoid alcohol.    If patient has another seizure, call 911 and bring them back to the ED if: A.  The seizure lasts longer than 5 minutes.      B.  The patient doesn't wake shortly after the seizure or has new problems such as difficulty seeing, speaking or moving following the seizure C.  The patient was injured during the seizure D.  The patient has a temperature over 102 F (39C) E.  The patient vomited during the seizure and now is having trouble breathing      Thank you for coming to see us  at [facility]. I hope we have been able to provide you high quality care today.  You may receive a patient satisfaction survey over the next few weeks. We would appreciate your feedback and comments so that we may continue to improve ourselves and the health of our patients.

## 2024-01-18 NOTE — Progress Notes (Signed)
 PATIENT: Diana Vega DOB: [birth year]  REASON FOR VISIT: follow up HISTORY FROM: patient  Virtual Visit via Video Note  I connected with Diana Vega on 01/18/24 at  2:15 PM EDT by a video enabled telemedicine application located remotely at [facility] and verified that I am speaking with the correct person using two identifiers who was located at their own home in [place]   I discussed the limitations of evaluation and management by telemedicine and the availability of in person appointments. The patient expressed understanding and agreed to proceed.   PATIENT: Diana Vega DOB: 01-19-1956  REASON FOR VISIT: follow up HISTORY FROM: patient  HISTORY OF PRESENT ILLNESS: Today 01/18/24:  Diana Vega is a 68 y.o. female with a history of seizures. Returns today for follow-up. Overall she reports that she is doing well. Denies any additional seizure events.  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  She saw Dr. Chalice at that time.  She states that she missed medication.  Unfortunately she did fracture her ankle and had to have surgery.  She remains on Keppra , Depakote  and Dilantin .  Tolerate the medication well.  Returns today for an evaluation.     01/15/23: Diana Vega is a 68 y.o. female with a history of Seizures. Returns today for follow-up.  She remains on Dilantin , Depakote  and Keppra .  Denies any seizure events.  No change in mood or behavior.  No change in gait or balance.  Able to complete all ADLs independently.  Continues to operate a motor vehicle.  Blood work was ordered at the last visit however she did not have this done.  Returns today for an evaluation.   01/20/22: Diana Vega is a 68 year old female with a history of seizures.  She returns today for virtual visit.  She remains on Dilantin  Depakote  and Keppra .  Denies any seizure events.  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  She returns  today for an evaluation.  01/20/21:   Diana Vega is a 68 year old female with a history of seizures.  She returns today for follow-up.  She has not had any seizure events.  Remains on Dilantin , Depakote  and Keppra .  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  Returns today for evaluation.   01/15/20: Diana Vega is a 68 year old female with a history of seizures.  She returns today for follow-up.  Overall she feels that she is doing well.  She is not had any seizure events.  Remains on Dilantin , Depakote  and Keppra .  Denies any new symptoms.  Returns today for an evaluation.   HISTORY 01/11/19: Diana Vega is a 68 year old female with a history of seizures.  She returns today for follow-up.  She reports that she has been doing well.  She continues on Dilantin , Depakote  and Keppra .  She denies any seizure events.  She denies any changes in her gait or balance.  No change in her mood or behavior.  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  She returns today for an evaluation.  REVIEW OF SYSTEMS: Out of a complete 14 system review of symptoms, the patient complains only of the following symptoms, and all other reviewed systems are negative.  ALLERGIES: Allergies  Allergen Reactions   Dilaudid [Hydromorphone Hcl]     acute renal failure    Betadine [Povidone-Iodine] Itching   Codeine     REACTION: GI upsets   Egg-Derived  Products    Hydrocodone -Acetaminophen      Percocet- REACTION: GI upsets   Toradol [Ketorolac Tromethamine]     ? Acute renal failure.   Zofran  [Ondansetron  Hcl] Nausea And Vomiting   Tape Rash    HOME MEDICATIONS: Outpatient Medications Prior to Visit  Medication Sig Dispense Refill   aluminum-magnesium hydroxide 200-200 MG/5ML suspension Take 30 mLs by mouth every 6 (six) hours as needed for indigestion (Gastric issues.).     aspirin  (BAYER ASPIRIN ) 325 MG tablet Take 1 tablet by mouth for 30 DAYS for blood clot prevention 30 tablet 0    divalproex  (DEPAKOTE ) 500 MG DR tablet Take 3 tablets (1,500 mg total) by mouth 2 (two) times daily. 540 tablet 3   ibuprofen (ADVIL,MOTRIN) 200 MG tablet Take 600 mg by mouth every 6 (six) hours as needed for headache or mild pain.     levETIRAcetam  (KEPPRA ) 500 MG tablet Take 1 tablet (500 mg total) by mouth every morning AND 2 tablets (1,000 mg total) at bedtime. 270 tablet 3   phenytoin  (DILANTIN ) 100 MG ER capsule Take 2 capsules (200 mg total) by mouth 2 (two) times daily. 360 capsule 3   No facility-administered medications prior to visit.    PAST MEDICAL HISTORY: Past Medical History:  Diagnosis Date   GERD (gastroesophageal reflux disease)    Meningitis    Neuromuscular disorder (HCC)    epipepsy   Seizure (HCC)    seizure 11/10/17    PAST SURGICAL HISTORY: Past Surgical History:  Procedure Laterality Date   ABDOMINAL HYSTERECTOMY     APPENDECTOMY     CHOLECYSTECTOMY     ORIF ANKLE FRACTURE Right 03/02/2023   Procedure: TREATMENT OF RIGHT INTRA-ARTICULAR PILON ANKLE FRACTURE WITH ASSOCIATED FIBULA FRACTURE, OPEN TREATMENT OF SYNDESMOSIS;  Surgeon: Elsa Lonni SAUNDERS, MD;  Location: [facility];  Service: Orthopedics;  Laterality: Right;    FAMILY HISTORY: Family History  Problem Relation Age of Onset   Cancer Mother        colon   Cancer Father        Breast   Cancer Brother        Prostate   Kidney failure Brother    Hepatitis C Brother    Cancer Brother        Prostate    SOCIAL HISTORY: Social History   Socioeconomic History   Marital status: Married    Spouse name: Not on file   Number of children: 4   Years of education: College   Highest education level: Not on file  Occupational History   Occupation: Teacher, adult education: [employer]  Tobacco Use   Smoking status: Every Day    Current packs/day: 0.50    Types: Cigarettes   Smokeless tobacco: Never  Vaping Use   Vaping status: Never Used  Substance and Sexual Activity   Alcohol use: No     Alcohol/week: 0.0 standard drinks of alcohol   Drug use: No   Sexual activity: Not on file  Other Topics Concern   Not on file  Social History Narrative   Patient lives at home with her husband.    Patient has 4 children.    Patient drinks 3 cups of caffeine daily.    Patient works as a Charity fundraiser for [employer].    Social Drivers of Corporate investment banker Strain: Not on file  Food Insecurity: Not on file  Transportation Needs: Not on file  Physical Activity: Not on file  Stress:  Not on file  Social Connections: Not on file  Intimate Partner Violence: Not on file      PHYSICAL EXAM Generalized: Well developed, in no acute distress   Neurological examination  Mentation: Alert oriented to time, place, history taking. Follows all commands speech and language fluent Cranial nerve II-XII: Facial symmetry noted Reflexes: UTA  DIAGNOSTIC DATA (LABS, IMAGING, TESTING) - I reviewed patient records, labs, notes, testing and imaging myself where available.  Lab Results  Component Value Date   WBC 8.8 09/06/2023   HGB 14.7 09/06/2023   HCT 42.3 09/06/2023   MCV 93 09/06/2023   PLT 260 09/06/2023      Component Value Date/Time   NA 138 09/06/2023 1135   K 4.6 09/06/2023 1135   CL 100 09/06/2023 1135   CO2 22 09/06/2023 1135   GLUCOSE 87 09/06/2023 1135   GLUCOSE 161 (H) 02/19/2023 1407   BUN 16 09/06/2023 1135   CREATININE 0.57 09/06/2023 1135   CALCIUM 9.4 09/06/2023 1135   PROT 6.5 09/06/2023 1135   ALBUMIN 4.3 09/06/2023 1135   AST 13 09/06/2023 1135   ALT 8 09/06/2023 1135   ALKPHOS 101 09/06/2023 1135   BILITOT <0.2 09/06/2023 1135   GFRNONAA >60 02/19/2023 1407   GFRAA 117 01/15/2020 1521   Lab Results  Component Value Date   CHOL 238 (H) 02/07/2010   HDL 82.00 02/07/2010   LDLDIRECT 120.2 02/07/2010   TRIG 112.0 02/07/2010   CHOLHDL 3 02/07/2010    Lab Results  Component Value Date   TSH 0.84 02/07/2010      ASSESSMENT AND PLAN 68 y.o. year old  female  has a past medical history of GERD (gastroesophageal reflux disease), Meningitis, Neuromuscular disorder (HCC), and Seizure (HCC). here with :  1. Seizures   - Continue Dilantin  200 mg twice a day - Continue  Depakote  1500 mg twice a day - COntinue  Keppra  500 mg in the morning and at 1000 mg in the evening - Had recent blood work in May that I reviewed in epic. - Advised if she has any seizure event she should let us  know - Follow-up in 1 year or sooner if needed      Duwaine Russell, MSN, NP-C 01/18/2024, 2:20 PM [facility] [address] [place], [place] [address] [phone]  The patient's condition requires frequent monitoring and adjustments in the treatment plan, reflecting the ongoing complexity of care.  This provider is the continuing focal point for all needed services for this condition.

## 2024-05-24 ENCOUNTER — Encounter: Payer: Self-pay | Admitting: Neurology

## 2024-05-24 NOTE — Addendum Note (Signed)
 Addended by: CHALICE SAUNAS on: 05/24/2024 05:58 PM   Modules accepted: Orders

## 2024-05-24 NOTE — Progress Notes (Signed)
 osteopenia risk in long term epilepsy patient on phenytoin , had multiple fractures but no bone density on record. Please note that I ordered bone density test DEX Test to be done before June 2026 .Dedra Maraki Macquarrie,MD

## 2024-11-20 ENCOUNTER — Ambulatory Visit: Admitting: Adult Health
